# Patient Record
Sex: Female | Born: 1990 | Race: Asian | Hispanic: Yes | Marital: Married | Smoking: Former smoker
Health system: Southern US, Community
[De-identification: ages and names within clinical notes are randomized; demographics above are authoritative.]

## PROBLEM LIST (undated history)

## (undated) ENCOUNTER — Inpatient Hospital Stay (HOSPITAL_COMMUNITY): Payer: Self-pay

## (undated) DIAGNOSIS — B999 Unspecified infectious disease: Secondary | ICD-10-CM

## (undated) DIAGNOSIS — Z5189 Encounter for other specified aftercare: Secondary | ICD-10-CM

## (undated) DIAGNOSIS — R519 Headache, unspecified: Secondary | ICD-10-CM

## (undated) DIAGNOSIS — M419 Scoliosis, unspecified: Secondary | ICD-10-CM

## (undated) HISTORY — PX: HERNIA REPAIR: SHX51

## (undated) HISTORY — PX: BREAST SURGERY: SHX581

## (undated) HISTORY — PX: NO PAST SURGERIES: SHX2092

---

## 2012-07-10 DIAGNOSIS — M419 Scoliosis, unspecified: Secondary | ICD-10-CM

## 2012-07-10 HISTORY — DX: Scoliosis, unspecified: M41.9

## 2013-03-30 ENCOUNTER — Encounter (HOSPITAL_COMMUNITY): Payer: Self-pay | Admitting: *Deleted

## 2013-03-30 ENCOUNTER — Emergency Department (HOSPITAL_COMMUNITY): Payer: Self-pay

## 2013-03-30 ENCOUNTER — Emergency Department (HOSPITAL_COMMUNITY)
Admission: EM | Admit: 2013-03-30 | Discharge: 2013-03-30 | Disposition: A | Discharge status: Home / Self Care | Payer: Self-pay | Attending: Emergency Medicine | Admitting: Emergency Medicine

## 2013-03-30 DIAGNOSIS — O2390 Unspecified genitourinary tract infection in pregnancy, unspecified trimester: Secondary | ICD-10-CM | POA: Insufficient documentation

## 2013-03-30 DIAGNOSIS — R300 Dysuria: Secondary | ICD-10-CM | POA: Insufficient documentation

## 2013-03-30 DIAGNOSIS — Z792 Long term (current) use of antibiotics: Secondary | ICD-10-CM | POA: Insufficient documentation

## 2013-03-30 DIAGNOSIS — O368121 Decreased fetal movements, second trimester, fetus 1: Secondary | ICD-10-CM

## 2013-03-30 DIAGNOSIS — N898 Other specified noninflammatory disorders of vagina: Secondary | ICD-10-CM | POA: Insufficient documentation

## 2013-03-30 DIAGNOSIS — O368190 Decreased fetal movements, unspecified trimester, not applicable or unspecified: Secondary | ICD-10-CM | POA: Insufficient documentation

## 2013-03-30 DIAGNOSIS — Z79899 Other long term (current) drug therapy: Secondary | ICD-10-CM | POA: Insufficient documentation

## 2013-03-30 DIAGNOSIS — O9989 Other specified diseases and conditions complicating pregnancy, childbirth and the puerperium: Secondary | ICD-10-CM | POA: Insufficient documentation

## 2013-03-30 DIAGNOSIS — N390 Urinary tract infection, site not specified: Secondary | ICD-10-CM

## 2013-03-30 DIAGNOSIS — Z3490 Encounter for supervision of normal pregnancy, unspecified, unspecified trimester: Secondary | ICD-10-CM

## 2013-03-30 LAB — CBC WITH DIFFERENTIAL/PLATELET
Basophils Absolute: 0 10*3/uL (ref 0.0–0.1)
Basophils Relative: 0 % (ref 0–1)
Eosinophils Absolute: 0.1 10*3/uL (ref 0.0–0.7)
Eosinophils Relative: 1 % (ref 0–5)
Lymphocytes Relative: 18 % (ref 12–46)
MCH: 32.4 pg (ref 26.0–34.0)
MCV: 90 fL (ref 78.0–100.0)
Neutro Abs: 7.9 10*3/uL — ABNORMAL HIGH (ref 1.7–7.7)
Platelets: 214 10*3/uL (ref 150–400)
RDW: 13.5 % (ref 11.5–15.5)
WBC: 10.6 10*3/uL — ABNORMAL HIGH (ref 4.0–10.5)

## 2013-03-30 LAB — URINALYSIS, ROUTINE W REFLEX MICROSCOPIC
Bilirubin Urine: NEGATIVE
Hgb urine dipstick: NEGATIVE
Protein, ur: NEGATIVE mg/dL
Urobilinogen, UA: 1 mg/dL (ref 0.0–1.0)

## 2013-03-30 LAB — COMPREHENSIVE METABOLIC PANEL
ALT: 8 U/L (ref 0–35)
AST: 13 U/L (ref 0–37)
Albumin: 3.2 g/dL — ABNORMAL LOW (ref 3.5–5.2)
Alkaline Phosphatase: 42 U/L (ref 39–117)
CO2: 25 mEq/L (ref 19–32)
Calcium: 8.7 mg/dL (ref 8.4–10.5)
Chloride: 104 mEq/L (ref 96–112)
GFR calc Af Amer: >90 mL/min (ref >90)
GFR calc non Af Amer: >90 mL/min (ref >90)
Glucose, Bld: 83 mg/dL (ref 70–99)
Potassium: 3.6 mEq/L (ref 3.5–5.1)
Sodium: 137 mEq/L (ref 135–145)
Total Protein: 6.8 g/dL (ref 6.0–8.3)

## 2013-03-30 LAB — WET PREP, GENITAL
Clue Cells Wet Prep HPF POC: NONE SEEN
Trich, Wet Prep: NONE SEEN

## 2013-03-30 LAB — URINE MICROSCOPIC-ADD ON

## 2013-03-30 MED ORDER — CEPHALEXIN 500 MG PO CAPS: 500.0000 mg | ORAL_CAPSULE | Freq: Two times a day (BID) | ORAL | Status: DC

## 2013-03-30 MED ORDER — SODIUM CHLORIDE 0.9 % IV BOLUS (SEPSIS)
500.0000 mL | Freq: Once | INTRAVENOUS | Status: AC
  Administered 2013-03-30: 500 mL via INTRAVENOUS

## 2013-03-30 MED ORDER — DEXTROSE 5 % IV SOLN
1.0000 g | INTRAVENOUS | Status: DC
  Administered 2013-03-30: 1 g via INTRAVENOUS
  Filled 2013-03-30: qty 10

## 2013-03-30 NOTE — Progress Notes (Addendum)
Pt visiting from [place]. States she has not felt fm in 1 week. States her [facility] is Jun 24 2013.Audible fm. fhr briefly at 150bpm. Unable to get a fhr tracing due to fm. Pt is going to have an OB ultrsound. Pt also has a yellow-greenish vaginal d/c. She is c/o some abd pain. No vaginal bleeding or leaking of fluid.

## 2013-03-30 NOTE — ED Notes (Signed)
Fetal heart tones dopplered mid-lower abd 140-164  Beats per minute

## 2013-03-30 NOTE — Progress Notes (Signed)
RROB-RN spoke with Dr Marice Potter; told of pt with conflicting dates; date by lmp=27 5/7; date from todays u/s=18 6/7; pt here due to cramping, decreased fetal movement and green discharge.  Told Dr of positive fetal movement, fhr reactive and reassuring, no contractions seen, no vaginal bleeding; cervix closed and 4.9cm in length.  Pt diagnosed with uti, receiving antibiotics now for uti, ED Dr will perform pelvic exam and collect cultures and treat if anything seen.  Dr Marice Potter said that monitoring may be d/c'd, ED to continue with care and to d/c pt when cleared in ED.

## 2013-03-30 NOTE — ED Notes (Signed)
Pt returned from ultrasound

## 2013-03-30 NOTE — ED Notes (Signed)
Rapid OB at bedside 

## 2013-03-30 NOTE — ED Provider Notes (Signed)
CSN: [id number]     Arrival date & time 03/30/13  1543 History   First MD Initiated Contact with Patient 03/30/13 1630     Chief Complaint  Patient presents with  . 5 months preg  problems    (Consider location/radiation/quality/duration/timing/severity/associated sxs/prior Treatment) HPI Comments: 22 yo female from [place] presents with decreased fetal movement for one week.  Third pregnancy, no acute issues with this pregnancy but had an [place] outpt to check sex of baby.  No bleeding, mild discharge and dysuria.  No fevers.  Pt planning on moving to [place].  Mild lower cramping.  Intermittent.  Improves with time. Pt feels from dates she is 5 mo pregnant.  The history is provided by the patient.    Past Medical History  Diagnosis Date  . Preterm labor     pt-contractions with first pregnancy-del term   Past Surgical History  Procedure Laterality Date  . No past surgeries     History reviewed. No pertinent family history. History  Substance Use Topics  . Smoking status: Never Smoker   . Smokeless tobacco: Not on file  . Alcohol Use: No   OB History   Grav Para Term Preterm Abortions TAB SAB Ect Mult Living   3 2 2       2      Review of Systems  Constitutional: Negative for fever and chills.  HENT: Negative for neck pain and neck stiffness.   Eyes: Negative for visual disturbance.  Respiratory: Negative for shortness of breath.   Cardiovascular: Negative for chest pain.  Gastrointestinal: Negative for vomiting and abdominal pain.  Genitourinary: Positive for dysuria, vaginal discharge and vaginal pain. Negative for flank pain.  Musculoskeletal: Negative for back pain.  Skin: Negative for rash.  Neurological: Negative for light-headedness and headaches.    Allergies  Review of patient's allergies indicates no known allergies.  Home Medications   Current Outpatient Rx  Name  Route  Sig  Dispense  Refill  . Prenatal Vit-Fe Fumarate-FA (PRENATAL MULTIVITAMIN) TABS  tablet   Oral   Take 1 tablet by mouth daily at 12 noon.         . cephALEXin (KEFLEX) 500 MG capsule   Oral   Take 1 capsule (500 mg total) by mouth 2 (two) times daily.   14 capsule   0    BP 102/57  Pulse 80  Temp(Src) 98.4 F (36.9 C) (Oral)  Resp 18  SpO2 100% Physical Exam  Nursing note and vitals reviewed. Constitutional: She is oriented to person, place, and time. She appears well-developed and well-nourished.  HENT:  Head: Normocephalic and atraumatic.  Eyes: Conjunctivae are normal. Right eye exhibits no discharge. Left eye exhibits no discharge.  Neck: Normal range of motion. Neck supple. No tracheal deviation present.  Cardiovascular: Normal rate and regular rhythm.   Pulmonary/Chest: Effort normal and breath sounds normal.  Abdominal: Soft. She exhibits no distension. There is no tenderness. There is no guarding.  Genitourinary:  No lesions, mild white discharge, no focal pain  Musculoskeletal: She exhibits no edema.  Neurological: She is alert and oriented to person, place, and time.  Skin: Skin is warm. No rash noted.  Psychiatric: She has a normal mood and affect.    ED Course  Procedures (including critical care time) EMERGENCY DEPARTMENT [place] PREGNANCY "Study: Limited Ultrasound of the Pelvis for Pregnancy"  INDICATIONS:Pregnancy(required) Multiple views of the uterus and pelvic cavity were obtained in real-time with a multi-frequency probe.  APPROACH:Transabdominal  PERFORMED BY: Myself  IMAGES ARCHIVED?: Yes   PREGNANCY FREE FLUID: None   PREGNANCY FINDINGS: Intrauterine gestational sac noted and Fetal heart activity seen  INTERPRETATION: Viable intrauterine pregnancy  GESTATIONAL AGE, ESTIMATE: 18 wk 2 days  FETAL HEART RATE: 140s     Labs Review Labs Reviewed  WET PREP, GENITAL - Abnormal; Notable for the following:    WBC, Wet Prep HPF POC TOO NUMEROUS TO COUNT (*)    All other components within normal limits  URINALYSIS,  ROUTINE W REFLEX MICROSCOPIC - Abnormal; Notable for the following:    APPearance CLOUDY (*)    Ketones, ur 15 (*)    Leukocytes, UA LARGE (*)    All other components within normal limits  CBC WITH DIFFERENTIAL - Abnormal; Notable for the following:    WBC 10.6 (*)    HCT 35.0 (*)    Neutro Abs 7.9 (*)    All other components within normal limits  COMPREHENSIVE METABOLIC PANEL - Abnormal; Notable for the following:    Creatinine, Ser 0.46 (*)    Albumin 3.2 (*)    Total Bilirubin 0.2 (*)    All other components within normal limits  URINE MICROSCOPIC-ADD ON - Abnormal; Notable for the following:    Squamous Epithelial / LPF FEW (*)    Bacteria, UA MANY (*)    All other components within normal limits  URINE CULTURE  GC/CHLAMYDIA PROBE AMP   Imaging Review US Ob Limited  03/30/2013   *RADIOLOGY REPORT*  Clinical Data: Decreased fetal movement.  LIMITED OBSTETRIC ULTRASOUND  Number of Fetuses: 1 Heart Rate: 149 bpm Movement: Yes  Presentation: Cephalic Placental Location: Posterior Previa: No Amniotic Fluid (Subjective): Lower limits of normal.  BPD:  4.3 cm      18 w  6 d         EDC:  08/25/2013  MATERNAL FINDINGS: Cervix: Closed.  4.9 cm in length. Uterus/Adnexae:  Normal.  IMPRESSION: Normal appearing single intrauterine pregnancy of approximately 18- week 6 days gestation.  Recommend followup with non-emergent complete OB 14+ wk US examination for fetal biometric evaluation and anatomic survey if not already performed.   Original Report Authenticated By: Francene Boyers, M.D.    MDM   1. Decreased fetal movement in pregnancy, second trimester, fetus 1   2. UTI (urinary tract infection)   3. Pregnancy   4. Vaginal discharge    Bedside US confirmed IUP, 18 wks, normal hr.  Because pt felt she was further along formal US ordered. FOrmal US confirmed bedside.  UTI, rocephin and fluid bolus.  PO abx for home. OB nurse monitored infant, no acute issues.  FUp discussed.    DC    Enid Skeens, MD 03/30/13 2016

## 2013-03-30 NOTE — ED Notes (Signed)
The pt  Is 5 months preg and she has not felt the baby  Move for 7 days.  This her third pregnancy. C/o abd pain also lmp April.  She is visiting  From [place]  And saw her ob doctor the 5th of last month

## 2013-03-30 NOTE — ED Notes (Signed)
Ob rapid response rn notified and the pt is in the dept

## 2013-03-31 LAB — GC/CHLAMYDIA PROBE AMP: GC Probe RNA: NEGATIVE

## 2013-04-01 LAB — URINE CULTURE: Colony Count: 85000

## 2013-05-12 ENCOUNTER — Inpatient Hospital Stay (HOSPITAL_COMMUNITY)
Admission: AD | Admit: 2013-05-12 | Discharge: 2013-05-13 | Disposition: A | Discharge status: Home / Self Care | Payer: Medicaid Other | Source: Ambulatory Visit | Attending: Obstetrics & Gynecology | Admitting: Obstetrics & Gynecology

## 2013-05-12 ENCOUNTER — Encounter (HOSPITAL_COMMUNITY): Payer: Self-pay

## 2013-05-12 DIAGNOSIS — A499 Bacterial infection, unspecified: Secondary | ICD-10-CM | POA: Insufficient documentation

## 2013-05-12 DIAGNOSIS — R300 Dysuria: Secondary | ICD-10-CM | POA: Insufficient documentation

## 2013-05-12 DIAGNOSIS — B9689 Other specified bacterial agents as the cause of diseases classified elsewhere: Secondary | ICD-10-CM | POA: Insufficient documentation

## 2013-05-12 DIAGNOSIS — M545 Low back pain, unspecified: Secondary | ICD-10-CM | POA: Insufficient documentation

## 2013-05-12 DIAGNOSIS — N390 Urinary tract infection, site not specified: Secondary | ICD-10-CM | POA: Insufficient documentation

## 2013-05-12 DIAGNOSIS — N760 Acute vaginitis: Secondary | ICD-10-CM | POA: Insufficient documentation

## 2013-05-12 LAB — CBC
HCT: 30.2 % — ABNORMAL LOW (ref 36.0–46.0)
MCH: 31.5 pg (ref 26.0–34.0)
MCHC: 35.1 g/dL (ref 30.0–36.0)
MCV: 89.6 fL (ref 78.0–100.0)
Platelets: 247 10*3/uL (ref 150–400)
RBC: 3.37 MIL/uL — ABNORMAL LOW (ref 3.87–5.11)
RDW: 12.4 % (ref 11.5–15.5)
WBC: 11 10*3/uL — ABNORMAL HIGH (ref 4.0–10.5)

## 2013-05-12 LAB — URINALYSIS, ROUTINE W REFLEX MICROSCOPIC
Bilirubin Urine: NEGATIVE
Ketones, ur: NEGATIVE mg/dL
Nitrite: POSITIVE — AB
Protein, ur: NEGATIVE mg/dL

## 2013-05-12 LAB — WET PREP, GENITAL

## 2013-05-12 LAB — URINE MICROSCOPIC-ADD ON

## 2013-05-12 MED ORDER — METRONIDAZOLE 500 MG PO TABS: 500.0000 mg | ORAL_TABLET | Freq: Two times a day (BID) | ORAL | Status: DC

## 2013-05-12 MED ORDER — CEFTRIAXONE SODIUM 1 G IJ SOLR
1.0000 g | Freq: Once | INTRAMUSCULAR | Status: AC
  Administered 2013-05-12: 1 g via INTRAMUSCULAR
  Filled 2013-05-12: qty 10

## 2013-05-12 MED ORDER — SULFAMETHOXAZOLE-TRIMETHOPRIM 800-160 MG PO TABS: 1.0000 | ORAL_TABLET | Freq: Two times a day (BID) | ORAL | Status: DC

## 2013-05-12 NOTE — MAU Provider Note (Signed)
History   CSN: [id number]  Arrival date and time: 05/12/13 1852   None      Ataya Murdy is a 22 y.o. [id number] with history of UTI diagnosed in ER late September who maintains that symptoms of dysuria and incomplete bladder emptying have persisted in spite of completing a full course of Keflex as prescribed. She also endorses low back pain (CVA) on the right side. The patient also endorses a variable vaginal discharge ranging from thin and yellow/green to thick and white. Patient reports minimal vaginal tenderness mid-coitus which is not otherwise present. Patient denies vaginal bleeding, fever, and other constitutional symptoms. She reports having the same sexual partner for the past 7 years, has not had any additional partners, and does not suspect infidelity from her partner. She tested negative for G/C while in the ER. Patient's urine positive for nitrites and bacteria. The patient [place] from [place] of [place] and has not pursued [place] Medicaid or any form of prenatal care at this time; she did have US performed in the ER. Discussed pregnancy verification letter and trying to schedule an appointment at [facility] clinic for prenatal care. The patient denies ETOH, tobacco use, and illicit drug use. The patient denies fever.   Chief Complaint  Patient presents with  . Abdominal Pain  . Back Pain   Abdominal Pain Associated symptoms include dysuria and frequency. Pertinent negatives include no fever, hematuria, melena, myalgias, nausea, vomiting or weight loss.  Back Pain Associated symptoms include abdominal pain and dysuria. Pertinent negatives include no chest pain, fever, tingling, weakness or weight loss.    OB History   Grav Para Term Preterm Abortions TAB SAB Ect Mult Living   3 2 2       2       Past Medical History  Diagnosis Date  . Preterm labor     pt-contractions with first pregnancy-del term    Past Surgical History  Procedure Laterality Date  . No past surgeries       History reviewed. No pertinent family history.  History  Substance Use Topics  . Smoking status: Never Smoker   . Smokeless tobacco: Not on file  . Alcohol Use: No    Allergies: No Known Allergies  Prescriptions prior to admission  Medication Sig Dispense Refill  . calcium carbonate (TUMS - DOSED IN MG ELEMENTAL CALCIUM) 500 MG chewable tablet Chew 1 tablet by mouth 2 (two) times daily as needed for heartburn.        Review of Systems  Constitutional: Negative.  Negative for fever, chills, weight loss and diaphoresis.  HENT: Negative.   Eyes: Negative.  Negative for blurred vision and double vision.  Respiratory: Negative for cough, shortness of breath and wheezing.   Cardiovascular: Negative for chest pain, palpitations, claudication (patient does endorse some posterior radiculopathy in L lower extremity which has increased slightly in pregnancy.) and leg swelling.  Gastrointestinal: Positive for abdominal pain. Negative for heartburn, nausea, vomiting, blood in stool and melena.  Genitourinary: Positive for dysuria, urgency, frequency and flank pain. Negative for hematuria.       Pt endorses sensation of incomplete voiding. Urine is not cloudy, dark, or obvious for any blood. Urine normal color per pt.  Musculoskeletal: Positive for back pain (lumbar back pain / cva tenderness). Negative for joint pain and myalgias.  Skin: Negative.  Negative for itching and rash.  Neurological: Negative for dizziness, tingling, sensory change, focal weakness, seizures and weakness.  Endo/Heme/Allergies: Negative.   Psychiatric/Behavioral: Negative.  Physical Exam   Blood pressure 130/70, pulse 95, temperature 98.6 F (37 C), resp. rate 18, height 5\' 7"  (1.702 m), weight 163 lb (73.936 kg), last menstrual period 11/02/2012.  Physical Exam  Constitutional: She is oriented to person, place, and time. She appears well-developed and well-nourished. No distress.  HENT:  Head: Normocephalic  and atraumatic.  Right Ear: External ear normal.  Left Ear: External ear normal.  Eyes: Conjunctivae are normal. Right eye exhibits no discharge. Left eye exhibits no discharge.  Neck: No JVD present. No tracheal deviation present.  Cardiovascular: Normal rate, normal heart sounds and intact distal pulses.  Exam reveals no gallop and no friction rub.   No murmur heard. Respiratory: No stridor. No respiratory distress. She has no wheezes. She has no rales. She exhibits no tenderness.  GI: Soft. She exhibits no distension and no mass. There is tenderness (suprapubic). There is CVA tenderness (right side). There is no rebound and no guarding.  Genitourinary: Uterus is enlarged (appropriate for GA). Uterus is not tender. Cervix exhibits no motion tenderness, no discharge and no friability. No bleeding around the vagina. Tenderness: moderate amount of thin, white discharge noted with some mucus. Vaginal discharge found.  Musculoskeletal: She exhibits no edema and no tenderness.  Neurological: She is alert and oriented to person, place, and time. She has normal reflexes.  Skin: Skin is warm and dry. No rash noted. She is not diaphoretic. No erythema. No pallor.  Psychiatric: She has a normal mood and affect. Her behavior is normal. Judgment and thought content normal.  Cervix: closed, long, thick and posterior  Results for orders placed during the hospital encounter of 05/12/13 (from the past 24 hour(s))  URINALYSIS, ROUTINE W REFLEX MICROSCOPIC     Status: Abnormal   Collection Time    05/12/13  7:40 PM      Result Value Range   Color, Urine YELLOW  YELLOW   APPearance HAZY (*) CLEAR   Specific Gravity, Urine 1.010  1.005 - 1.030   pH 6.5  5.0 - 8.0   Glucose, UA NEGATIVE  NEGATIVE mg/dL   Hgb urine dipstick NEGATIVE  NEGATIVE   Bilirubin Urine NEGATIVE  NEGATIVE   Ketones, ur NEGATIVE  NEGATIVE mg/dL   Protein, ur NEGATIVE  NEGATIVE mg/dL   Urobilinogen, UA 0.2  0.0 - 1.0 mg/dL   Nitrite  POSITIVE (*) NEGATIVE   Leukocytes, UA SMALL (*) NEGATIVE  URINE MICROSCOPIC-ADD ON     Status: Abnormal   Collection Time    05/12/13  7:40 PM      Result Value Range   Squamous Epithelial / LPF RARE  RARE   WBC, UA 3-6  <3 WBC/hpf   Bacteria, UA MANY (*) RARE  CBC     Status: Abnormal   Collection Time    05/12/13 10:55 PM      Result Value Range   WBC 11.0 (*) 4.0 - 10.5 K/uL   RBC 3.37 (*) 3.87 - 5.11 MIL/uL   Hemoglobin 10.6 (*) 12.0 - 15.0 g/dL   HCT 16.1 (*) 09.6 - 04.5 %   MCV 89.6  78.0 - 100.0 fL   MCH 31.5  26.0 - 34.0 pg   MCHC 35.1  30.0 - 36.0 g/dL   RDW 40.9  81.1 - 91.4 %   Platelets 247  150 - 400 K/uL  WET PREP, GENITAL     Status: Abnormal   Collection Time    05/12/13 11:10 PM  Result Value Range   Yeast Wet Prep HPF POC NONE SEEN  NONE SEEN   Trich, Wet Prep NONE SEEN  NONE SEEN   Clue Cells Wet Prep HPF POC FEW (*) NONE SEEN   WBC, Wet Prep HPF POC FEW (*) NONE SEEN    MAU Course  Procedures  MDM Urinalysis (positive for nitrites and bacteria)  Assessment and Plan   2300 - Care assumed from [place], CNM and [employer], PA-S  Fetal Monitoring: Baseline: 145 bpm, moderate variability, + accelerations, no decelerations Contractions: none  MDM CBC drawn Pelvic exam complete and noted above Discussed patient with Dr. Marice Potter. Low suspicion for pyelonephritis at this time. Treat outpatient with bactrim x 7 days and give IM Rocephin in MAU today for resistant UTI  A: UTI Bacterial vaginosis  P: Discharge home Rx for Bactrim and Flagyl given to patient Patient advised to increase PO hydration as tolerated Pregnancy confirmation letter given with Medicaid assistance information Patient referred to [facility] clinic to start prenatal care Discussed warning signs for Pyelonephritis Patient may return to MAU as needed or if her condition were to change or worsen  Freddi Starr, PA-C 05/12/2013 11:53 PM

## 2013-05-12 NOTE — MAU Note (Signed)
Pt G3 P2, unknown EDC, no prenatal care, having lower abd and back pain x 3 days that became constant today.  Mild pain with urination, denies bleeding, white, watery discharge.

## 2013-05-15 LAB — URINE CULTURE

## 2013-05-16 ENCOUNTER — Telehealth (HOSPITAL_COMMUNITY): Payer: Self-pay | Admitting: Obstetrics and Gynecology

## 2013-05-16 MED ORDER — NITROFURANTOIN MONOHYD MACRO 100 MG PO CAPS: 100.0000 mg | ORAL_CAPSULE | Freq: Two times a day (BID) | ORAL | Status: DC

## 2013-05-16 NOTE — Telephone Encounter (Signed)
Informed pt that bactrim was resistant and that I changed her antibiotic to Macrobid 1 pill BID times 7 days. I informed her to stop the bactrim and call us with any concerns. Pt will pick up the medication at her pharmacy.

## 2013-05-29 ENCOUNTER — Encounter: Payer: Self-pay | Admitting: Family

## 2013-05-29 ENCOUNTER — Ambulatory Visit (INDEPENDENT_AMBULATORY_CARE_PROVIDER_SITE_OTHER): Payer: Medicaid Other | Admitting: Family

## 2013-05-29 VITALS — BP 114/74 | Temp 98.3°F | Ht 66.0 in | Wt 165.4 lb

## 2013-05-29 DIAGNOSIS — O0990 Supervision of high risk pregnancy, unspecified, unspecified trimester: Secondary | ICD-10-CM | POA: Insufficient documentation

## 2013-05-29 DIAGNOSIS — O09219 Supervision of pregnancy with history of pre-term labor, unspecified trimester: Secondary | ICD-10-CM

## 2013-05-29 LAB — POCT URINALYSIS DIP (DEVICE)
Bilirubin Urine: NEGATIVE
Hgb urine dipstick: NEGATIVE
Ketones, ur: NEGATIVE mg/dL
Protein, ur: NEGATIVE mg/dL
Specific Gravity, Urine: >=1.03 (ref 1.005–1.030)
pH: 7 (ref 5.0–8.0)

## 2013-05-29 MED ORDER — CONCEPT DHA 53.5-38-1 MG PO CAPS: 1.0000 | ORAL_CAPSULE | Freq: Every day | ORAL | Status: DC

## 2013-05-29 NOTE — Progress Notes (Signed)
Subjective:    Jennifer Chaney is a [id number] [redacted]w[redacted]d being seen today for her first obstetrical visit.  Her obstetrical history is significant for preterm dilation with term delivery and possible GBS infected infant.  Last infant was hospitalized and she was told she passed infection to baby.  . Patient does intend to breast feed. Pregnancy history fully reviewed.  Patient reports no bleeding, no contractions, no cramping and no leaking.  Filed Vitals:   05/29/13 0832 05/29/13 0837  BP: 114/74   Temp: 98.3 F (36.8 C)   Height:  5\' 6"  (1.676 m)  Weight: 165 lb 6.4 oz (75.025 kg)     HISTORY: OB History  Gravida Para Term Preterm AB SAB TAB Ectopic Multiple Living  4 2 2  1 1    2     # Outcome Date GA Lbr Len/2nd Weight Sex Delivery Anes PTL Lv  4 CUR           3 TRM 01/2012 [redacted]w[redacted]d  7 lb 6 oz (3.345 kg) F SVD EPI Y Y     Comments: Early preterm dilation (3 cm at 27 wks); states high risk baby too low, baby NICU breathing issues, born [place], [place]; GBS infection ?  2 TRM 12/2007 [redacted]w[redacted]d  6 lb 4 oz (2.835 kg) F SVD EPI  Y     Comments: PTL at 13 wk, born [place], [place]  1 SAB 2008 [redacted]w[redacted]d            Comments: Loss after falling (abdominal trauma)     Past Medical History  Diagnosis Date  . Preterm labor     pt-contractions with first pregnancy-del term   Past Surgical History  Procedure Laterality Date  . No past surgeries     History reviewed. No pertinent family history.   Exam    Filed Vitals:   05/29/13 0832  BP: 114/74  Temp: 98.3 F (36.8 C)   Exam   BP 114/74  Temp(Src) 98.3 F (36.8 C)  Ht 5\' 6"  (1.676 m)  Wt 165 lb 6.4 oz (75.025 kg)  BMI 26.71 kg/m2  LMP 11/02/2012 Uterine Size: size equals dates  Pelvic Exam:    Perineum: No Hemorrhoids, Normal Perineum   Vulva: normal   Vagina:  normal mucosa, normal discharge, no palpable nodules   pH: Not done   Cervix: no bleeding following Pap, no cervical motion tenderness and no lesions   Adnexa: normal adnexa  and no mass, fullness, tenderness   Bony Pelvis: Adequate  System: Breast:  No nipple retraction or dimpling, No nipple discharge or bleeding, No axillary or supraclavicular adenopathy, Normal to palpation without dominant masses   Skin: normal coloration and turgor, no rashes    Neurologic: negative   Extremities: normal strength, tone, and muscle mass   HEENT neck supple with midline trachea and thyroid without masses   Mouth/Teeth mucous membranes moist, pharynx normal without lesions   Neck supple and no masses   Cardiovascular: regular rate and rhythm, no murmurs or gallops   Respiratory:  appears well, vitals normal, no respiratory distress, acyanotic, normal RR, neck free of mass or lymphadenopathy, chest clear, no wheezing, crepitations, rhonchi, normal symmetric air entry   Abdomen: soft, non-tender; bowel sounds normal; no masses,  no organomegaly   Urinary: urethral meatus normal      Assessment:    Pregnancy: [id number] Patient Active Problem List   Diagnosis Date Noted  . Supervision of high-risk pregnancy 05/29/2013    Hx of Preterm  Dilation (term delivery)     Plan:     Initial labs drawn. 1 hr GCT collected. Prenatal vitamins. Problem list reviewed and updated. Genetic Screening:  Too Late  Ultrasound discussed; fetal survey: requested.  Follow up in 2 weeks.   [facility] 05/29/2013

## 2013-05-29 NOTE — Progress Notes (Signed)
  P= 103, Here for new ob. States took all of macrobid and flagyl, but was unable to get third medicine filled. States had IUD in when got pregnant, states was told it came out by itself by providers in [place]. C/o pelvic pressure/groin. Had some prenatal care in [place] , but not labs, etc.Given new patient information. Discussed bmi/appropriate weight gain.

## 2013-05-30 LAB — OBSTETRIC PANEL
Basophils Absolute: 0 10*3/uL (ref 0.0–0.1)
Eosinophils Absolute: 0.1 10*3/uL (ref 0.0–0.7)
Eosinophils Relative: 1 % (ref 0–5)
HCT: 31.9 % — ABNORMAL LOW (ref 36.0–46.0)
Hemoglobin: 11 g/dL — ABNORMAL LOW (ref 12.0–15.0)
Lymphocytes Relative: 21 % (ref 12–46)
Lymphs Abs: 1.9 10*3/uL (ref 0.7–4.0)
MCH: 31.2 pg (ref 26.0–34.0)
MCV: 90.4 fL (ref 78.0–100.0)
Monocytes Absolute: 0.8 10*3/uL (ref 0.1–1.0)
Monocytes Relative: 9 % (ref 3–12)
Platelets: 248 10*3/uL (ref 150–400)
RBC: 3.53 MIL/uL — ABNORMAL LOW (ref 3.87–5.11)
Rh Type: POSITIVE
Rubella: 1.33 Index — ABNORMAL HIGH (ref <0.90)
WBC: 9 10*3/uL (ref 4.0–10.5)

## 2013-05-30 LAB — PRESCRIPTION MONITORING PROFILE (19 PANEL)
Barbiturate Screen, Urine: NEGATIVE ng/mL
Benzodiazepine Screen, Urine: NEGATIVE ng/mL
Cannabinoid Scrn, Ur: NEGATIVE ng/mL
Cocaine Metabolites: NEGATIVE ng/mL
Creatinine, Urine: 205.41 mg/dL (ref >20.0)
Fentanyl, Ur: NEGATIVE ng/mL
MDMA URINE: NEGATIVE ng/mL
Meperidine, Ur: NEGATIVE ng/mL
Opiate Screen, Urine: NEGATIVE ng/mL
Oxycodone Screen, Ur: NEGATIVE ng/mL
Phencyclidine, Ur: NEGATIVE ng/mL
Tramadol Scrn, Ur: NEGATIVE ng/mL
Zolpidem, Urine: NEGATIVE ng/mL
pH, Initial: 6.7 pH (ref 4.5–8.9)

## 2013-05-31 ENCOUNTER — Encounter: Payer: Self-pay | Admitting: Family

## 2013-06-03 ENCOUNTER — Other Ambulatory Visit: Payer: Self-pay | Admitting: Family

## 2013-06-03 ENCOUNTER — Ambulatory Visit (HOSPITAL_COMMUNITY)
Admission: RE | Admit: 2013-06-03 | Discharge: 2013-06-03 | Disposition: A | Discharge status: Home / Self Care | Payer: Medicaid Other | Source: Ambulatory Visit | Attending: Family | Admitting: Family

## 2013-06-03 DIAGNOSIS — Z3689 Encounter for other specified antenatal screening: Secondary | ICD-10-CM | POA: Insufficient documentation

## 2013-06-03 DIAGNOSIS — O0990 Supervision of high risk pregnancy, unspecified, unspecified trimester: Secondary | ICD-10-CM

## 2013-06-04 ENCOUNTER — Encounter: Payer: Self-pay | Admitting: Family

## 2013-06-12 ENCOUNTER — Ambulatory Visit (INDEPENDENT_AMBULATORY_CARE_PROVIDER_SITE_OTHER): Payer: Medicaid Other | Admitting: Family

## 2013-06-12 VITALS — BP 118/72 | Temp 97.5°F | Wt 167.4 lb

## 2013-06-12 DIAGNOSIS — M419 Scoliosis, unspecified: Secondary | ICD-10-CM

## 2013-06-12 DIAGNOSIS — O0993 Supervision of high risk pregnancy, unspecified, third trimester: Secondary | ICD-10-CM

## 2013-06-12 DIAGNOSIS — M4120 Other idiopathic scoliosis, site unspecified: Secondary | ICD-10-CM

## 2013-06-12 DIAGNOSIS — Z23 Encounter for immunization: Secondary | ICD-10-CM

## 2013-06-12 DIAGNOSIS — M259 Joint disorder, unspecified: Secondary | ICD-10-CM

## 2013-06-12 LAB — POCT URINALYSIS DIP (DEVICE)
Bilirubin Urine: NEGATIVE
Ketones, ur: NEGATIVE mg/dL
Nitrite: NEGATIVE
Protein, ur: 30 mg/dL — AB
Specific Gravity, Urine: >=1.03 (ref 1.005–1.030)
Urobilinogen, UA: 0.2 mg/dL (ref 0.0–1.0)
pH: 7 (ref 5.0–8.0)

## 2013-06-12 MED ORDER — ACETAMINOPHEN-CODEINE 300-30 MG PO TABS: 1.0000 | ORAL_TABLET | Freq: Four times a day (QID) | ORAL | Status: DC | PRN

## 2013-06-12 MED ORDER — NITROFURANTOIN MONOHYD MACRO 100 MG PO CAPS: 100.0000 mg | ORAL_CAPSULE | Freq: Two times a day (BID) | ORAL | Status: DC

## 2013-06-12 NOTE — Progress Notes (Signed)
Reviewed OB labs/pap smear.  Report lower back pain that pt feels is related to scoliosis. Used to have an orthopedic doctor in [place] and was supposed to get surgery, but got pregnant.  Large leukocytes in urine > no UTI symptoms > RX Macrobid and urine culture.  RX for Tylenol #3 and referral to ortho.

## 2013-06-12 NOTE — Progress Notes (Signed)
p=106 

## 2013-06-12 NOTE — Progress Notes (Signed)
[facility] called and will see patient after they review her MRI/x-rays from [place]. Patient to drop off her records there and they will contact her for an appt. Contact information given to patient.

## 2013-06-12 NOTE — Progress Notes (Signed)
Declines Tdap and flu vaccine.

## 2013-06-14 LAB — CULTURE, OB URINE

## 2013-06-24 ENCOUNTER — Encounter: Payer: Self-pay | Admitting: *Deleted

## 2013-06-24 DIAGNOSIS — O9989 Other specified diseases and conditions complicating pregnancy, childbirth and the puerperium: Secondary | ICD-10-CM

## 2013-06-24 DIAGNOSIS — M259 Joint disorder, unspecified: Secondary | ICD-10-CM | POA: Insufficient documentation

## 2013-06-24 DIAGNOSIS — M899 Disorder of bone, unspecified: Secondary | ICD-10-CM

## 2013-06-26 ENCOUNTER — Ambulatory Visit (INDEPENDENT_AMBULATORY_CARE_PROVIDER_SITE_OTHER): Payer: Medicaid Other | Admitting: Family Medicine

## 2013-06-26 ENCOUNTER — Encounter: Payer: Self-pay | Admitting: Family Medicine

## 2013-06-26 VITALS — BP 112/73 | Temp 97.5°F | Wt 173.7 lb

## 2013-06-26 DIAGNOSIS — O2343 Unspecified infection of urinary tract in pregnancy, third trimester: Secondary | ICD-10-CM

## 2013-06-26 DIAGNOSIS — M549 Dorsalgia, unspecified: Secondary | ICD-10-CM

## 2013-06-26 DIAGNOSIS — Z23 Encounter for immunization: Secondary | ICD-10-CM

## 2013-06-26 DIAGNOSIS — O0993 Supervision of high risk pregnancy, unspecified, third trimester: Secondary | ICD-10-CM

## 2013-06-26 DIAGNOSIS — O2390 Unspecified genitourinary tract infection in pregnancy, unspecified trimester: Secondary | ICD-10-CM

## 2013-06-26 DIAGNOSIS — O9989 Other specified diseases and conditions complicating pregnancy, childbirth and the puerperium: Secondary | ICD-10-CM

## 2013-06-26 LAB — POCT URINALYSIS DIP (DEVICE)
Bilirubin Urine: NEGATIVE
Glucose, UA: NEGATIVE mg/dL
Hgb urine dipstick: NEGATIVE
Nitrite: NEGATIVE
Protein, ur: NEGATIVE mg/dL
Specific Gravity, Urine: 1.025 (ref 1.005–1.030)
pH: 7 (ref 5.0–8.0)

## 2013-06-26 MED ORDER — CYCLOBENZAPRINE HCL 5 MG PO TABS: 5.0000 mg | ORAL_TABLET | Freq: Three times a day (TID) | ORAL | Status: DC | PRN

## 2013-06-26 MED ORDER — TETANUS-DIPHTH-ACELL PERTUSSIS 5-2.5-18.5 LF-MCG/0.5 IM SUSP: 0.5000 mL | Freq: Once | INTRAMUSCULAR | Status: DC

## 2013-06-26 NOTE — Addendum Note (Signed)
Addended by: Candelaria Stagers E on: 06/26/2013 10:05 AM   Modules accepted: Orders

## 2013-06-26 NOTE — Progress Notes (Signed)
+  FM no lof no vb, occasional ctx. UTI tx with macrobid - TOC Complains of lower uterine pressure. Repeat culture today. Complains of back pain - flexeril today.  Jennifer Chaney is a 22 y.o. [id number] at [redacted]w[redacted]d here for ROB visit.  Discussed with Patient:  -Plans to breast feed.  All questions answered. -Continue prenatal vitamins. -Reviewed fetal kick counts Pt to perform daily at a time when the baby is active, lie laterally with both hands on belly in quiet room and count all movements (hiccups, shoulder rolls, obvious kicks, etc); pt is to report to clinic L&D for less than 10 movements felt in a one hour time period-pt told as soon as she counts 10 movements the count is complete.  - Routine precautions discussed (depression, infection s/s).   Patient provided with all pertinent phone numbers for emergencies. - RTC for any VB, regular, painful cramps/ctxs occurring at a rate of >2/10 min, fever (100.5 or higher), n/v/d, any pain that is unresolving or worsening, LOF, decreased fetal movement, CP, SOB, edema - RTC in 2 weeks for next appt.   Problems: Patient Active Problem List   Diagnosis Date Noted  . Bone and joint disorders of maternal back, pelvis, and lower limbs, antepartum(648.73) 06/24/2013  . Scoliosis 06/12/2013  . Supervision of high-risk pregnancy 05/29/2013    To Do: 1. Tdap will be given today  [ ]  Vaccines: Flu: declines  Tdap: 06/26/2013 [ ]  BCM: mirena [ ]  Readiness: baby has a place to sleep, car seat, other baby necessities.  Edu: [ ]  PTL precautions; [ ]  BF class; [ ]  childbirth class; [ ]   BF counseling;

## 2013-06-26 NOTE — Progress Notes (Signed)
P= 84 C/o of intermittent lower abdominal/pelvic pressure. Edema hands and feet. White/yellow discharge causing itching and discomfort.

## 2013-06-26 NOTE — Patient Instructions (Addendum)
Third Trimester of Pregnancy The third trimester is from week 29 through week 42, months 7 through 9. The third trimester is a time when the fetus is growing rapidly. At the end of the ninth month, the fetus is about 20 inches in length and weighs 6 10 pounds.  BODY CHANGES Your body goes through many changes during pregnancy. The changes vary from woman to woman.   Your weight will continue to increase. You can expect to gain 25 35 pounds (11 16 kg) by the end of the pregnancy.  You may begin to get stretch marks on your hips, abdomen, and breasts.  You may urinate more often because the fetus is moving lower into your pelvis and pressing on your bladder.  You may develop or continue to have heartburn as a result of your pregnancy.  You may develop constipation because certain hormones are causing the muscles that push waste through your intestines to slow down.  You may develop hemorrhoids or swollen, bulging veins (varicose veins).  You may have pelvic pain because of the weight gain and pregnancy hormones relaxing your joints between the bones in your pelvis. Back aches may result from over exertion of the muscles supporting your posture.  Your breasts will continue to grow and be tender. A yellow discharge may leak from your breasts called colostrum.  Your belly button may stick out.  You may feel short of breath because of your expanding uterus.  You may notice the fetus "dropping," or moving lower in your abdomen.  You may have a bloody mucus discharge. This usually occurs a few days to a week before labor begins.  Your cervix becomes thin and soft (effaced) near your due date. WHAT TO EXPECT AT YOUR PRENATAL EXAMS  You will have prenatal exams every 2 weeks until week 36. Then, you will have weekly prenatal exams. During a routine prenatal visit:  You will be weighed to make sure you and the fetus are growing normally.  Your blood pressure is taken.  Your abdomen will be  measured to track your baby's growth.  The fetal heartbeat will be listened to.  Any test results from the previous visit will be discussed.  You may have a cervical check near your due date to see if you have effaced. At around 36 weeks, your caregiver will check your cervix. At the same time, your caregiver will also perform a test on the secretions of the vaginal tissue. This test is to determine if a type of bacteria, Group B streptococcus, is present. Your caregiver will explain this further. Your caregiver may ask you:  What your birth plan is.  How you are feeling.  If you are feeling the baby move.  If you have had any abnormal symptoms, such as leaking fluid, bleeding, severe headaches, or abdominal cramping.  If you have any questions. Other tests or screenings that may be performed during your third trimester include:  Blood tests that check for low iron levels (anemia).  Fetal testing to check the health, activity level, and growth of the fetus. Testing is done if you have certain medical conditions or if there are problems during the pregnancy. FALSE LABOR You may feel small, irregular contractions that eventually go away. These are called Braxton Hicks contractions, or false labor. Contractions may last for hours, days, or even weeks before true labor sets in. If contractions come at regular intervals, intensify, or become painful, it is best to be seen by your caregiver.  SIGNS OF LABOR   Menstrual-like cramps.  Contractions that are 5 minutes apart or less.  Contractions that start on the top of the uterus and spread down to the lower abdomen and back.  A sense of increased pelvic pressure or back pain.  A watery or bloody mucus discharge that comes from the vagina. If you have any of these signs before the 37th week of pregnancy, call your caregiver right away. You need to go to the hospital to get checked immediately. HOME CARE INSTRUCTIONS   Avoid all  smoking, herbs, alcohol, and unprescribed drugs. These chemicals affect the formation and growth of the baby.  Follow your caregiver's instructions regarding medicine use. There are medicines that are either safe or unsafe to take during pregnancy.  Exercise only as directed by your caregiver. Experiencing uterine cramps is a good sign to stop exercising.  Continue to eat regular, healthy meals.  Wear a good support bra for breast tenderness.  Do not use hot tubs, steam rooms, or saunas.  Wear your seat belt at all times when driving.  Avoid raw meat, uncooked cheese, cat litter boxes, and soil used by cats. These carry germs that can cause birth defects in the baby.  Take your prenatal vitamins.  Try taking a stool softener (if your caregiver approves) if you develop constipation. Eat more high-fiber foods, such as fresh vegetables or fruit and whole grains. Drink plenty of fluids to keep your urine clear or pale yellow.  Take warm sitz baths to soothe any pain or discomfort caused by hemorrhoids. Use hemorrhoid cream if your caregiver approves.  If you develop varicose veins, wear support hose. Elevate your feet for 15 minutes, 3 4 times a day. Limit salt in your diet.  Avoid heavy lifting, wear low heal shoes, and practice good posture.  Rest a lot with your legs elevated if you have leg cramps or low back pain.  Visit your dentist if you have not gone during your pregnancy. Use a soft toothbrush to brush your teeth and be gentle when you floss.  A sexual relationship may be continued unless your caregiver directs you otherwise.  Do not travel far distances unless it is absolutely necessary and only with the approval of your caregiver.  Take prenatal classes to understand, practice, and ask questions about the labor and delivery.  Make a trial run to the hospital.  Pack your hospital bag.  Prepare the baby's nursery.  Continue to go to all your prenatal visits as directed  by your caregiver. SEEK MEDICAL CARE IF:  You are unsure if you are in labor or if your water has broken.  You have dizziness.  You have mild pelvic cramps, pelvic pressure, or nagging pain in your abdominal area.  You have persistent nausea, vomiting, or diarrhea.  You have a bad smelling vaginal discharge.  You have pain with urination. SEEK IMMEDIATE MEDICAL CARE IF:   You have a fever.  You are leaking fluid from your vagina.  You have spotting or bleeding from your vagina.  You have severe abdominal cramping or pain.  You have rapid weight loss or gain.  You have shortness of breath with chest pain.  You notice sudden or extreme swelling of your face, hands, ankles, feet, or legs.  You have not felt your baby move in over an hour.  You have severe headaches that do not go away with medicine.  You have vision changes. Document Released: 06/20/2001 Document Revised: 02/26/2013 Document Reviewed:   08/27/2012 [employer] Patient Information 2014 [place], [place]. Back Exercises These exercises may help you when beginning to rehabilitate your injury. Your symptoms may resolve with or without further involvement from your physician, physical therapist or athletic trainer. While completing these exercises, remember:   Restoring tissue flexibility helps normal motion to return to the joints. This allows healthier, less painful movement and activity.  An effective stretch should be held for at least 30 seconds.  A stretch should never be painful. You should only feel a gentle lengthening or release in the stretched tissue. STRETCH  Extension, Prone on Elbows   Lie on your stomach on the floor, a bed will be too soft. Place your palms about shoulder width apart and at the height of your head.  Place your elbows under your shoulders. If this is too painful, stack pillows under your chest.  Allow your body to relax so that your hips drop lower and make contact more completely  with the floor.  Hold this position for __________ seconds.  Slowly return to lying flat on the floor. Repeat __________ times. Complete this exercise __________ times per day.  RANGE OF MOTION  Extension, Prone Press Ups   Lie on your stomach on the floor, a bed will be too soft. Place your palms about shoulder width apart and at the height of your head.  Keeping your back as relaxed as possible, slowly straighten your elbows while keeping your hips on the floor. You may adjust the placement of your hands to maximize your comfort. As you gain motion, your hands will come more underneath your shoulders.  Hold this position __________ seconds.  Slowly return to lying flat on the floor. Repeat __________ times. Complete this exercise __________ times per day.  RANGE OF MOTION- Quadruped, Neutral Spine   Assume a hands and knees position on a firm surface. Keep your hands under your shoulders and your knees under your hips. You may place padding under your knees for comfort.  Drop your head and point your tail bone toward the ground below you. This will round out your low back like an angry cat. Hold this position for __________ seconds.  Slowly lift your head and release your tail bone so that your back sags into a large arch, like an old horse.  Hold this position for __________ seconds.  Repeat this until you feel limber in your low back.  Now, find your "sweet spot." This will be the most comfortable position somewhere between the two previous positions. This is your neutral spine. Once you have found this position, tense your stomach muscles to support your low back.  Hold this position for __________ seconds. Repeat __________ times. Complete this exercise __________ times per day.  STRETCH  Flexion, Single Knee to Chest   Lie on a firm bed or floor with both legs extended in front of you.  Keeping one leg in contact with the floor, bring your opposite knee to your chest. Hold  your leg in place by either grabbing behind your thigh or at your knee.  Pull until you feel a gentle stretch in your low back. Hold __________ seconds.  Slowly release your grasp and repeat the exercise with the opposite side. Repeat __________ times. Complete this exercise __________ times per day.  STRETCH - Hamstrings, Standing  Stand or sit and extend your right / left leg, placing your foot on a chair or foot stool  Keeping a slight arch in your low back and your hips  straight forward.  Lead with your chest and lean forward at the waist until you feel a gentle stretch in the back of your right / left knee or thigh. (When done correctly, this exercise requires leaning only a small distance.)  Hold this position for __________ seconds. Repeat __________ times. Complete this stretch __________ times per day. STRENGTHENING  Deep Abdominals, Pelvic Tilt   Lie on a firm bed or floor. Keeping your legs in front of you, bend your knees so they are both pointed toward the ceiling and your feet are flat on the floor.  Tense your lower abdominal muscles to press your low back into the floor. This motion will rotate your pelvis so that your tail bone is scooping upwards rather than pointing at your feet or into the floor.  With a gentle tension and even breathing, hold this position for __________ seconds. Repeat __________ times. Complete this exercise __________ times per day.  STRENGTHENING  Abdominals, Crunches   Lie on a firm bed or floor. Keeping your legs in front of you, bend your knees so they are both pointed toward the ceiling and your feet are flat on the floor. Cross your arms over your chest.  Slightly tip your chin down without bending your neck.  Tense your abdominals and slowly lift your trunk high enough to just clear your shoulder blades. Lifting higher can put excessive stress on the low back and does not further strengthen your abdominal muscles.  Control your return to  the starting position. Repeat __________ times. Complete this exercise __________ times per day.  STRENGTHENING  Quadruped, Opposite UE/LE Lift   Assume a hands and knees position on a firm surface. Keep your hands under your shoulders and your knees under your hips. You may place padding under your knees for comfort.  Find your neutral spine and gently tense your abdominal muscles so that you can maintain this position. Your shoulders and hips should form a rectangle that is parallel with the floor and is not twisted.  Keeping your trunk steady, lift your right hand no higher than your shoulder and then your left leg no higher than your hip. Make sure you are not holding your breath. Hold this position __________ seconds.  Continuing to keep your abdominal muscles tense and your back steady, slowly return to your starting position. Repeat with the opposite arm and leg. Repeat __________ times. Complete this exercise __________ times per day. Document Released: 07/14/2005 Document Revised: 09/18/2011 Document Reviewed: 10/08/2008 [facility] Patient Information 2014 [place], [place].

## 2013-06-26 NOTE — Addendum Note (Signed)
Addended by: Franchot Mimes on: 06/26/2013 11:52 AM   Modules accepted: Orders

## 2013-06-29 LAB — CULTURE, OB URINE: Colony Count: >=100000

## 2013-07-04 ENCOUNTER — Telehealth: Payer: Self-pay | Admitting: Family Medicine

## 2013-07-04 MED ORDER — CEPHALEXIN 500 MG PO CAPS: 500.0000 mg | ORAL_CAPSULE | Freq: Two times a day (BID) | ORAL | Status: DC

## 2013-07-04 MED ORDER — CEPHALEXIN 500 MG PO CAPS: 500.0000 mg | ORAL_CAPSULE | Freq: Four times a day (QID) | ORAL | Status: DC

## 2013-07-04 NOTE — Telephone Encounter (Signed)
TOC + for persistent E. Coli infection. Will treat with keflex 500mg  QID x7 days, then decrease to daily for suppression pt will need TOC in clinic. Unable to contact based on numbers in demographics. Will have nursing attempt to contact.  Tawana Scale, MD OB Fellow

## 2013-07-07 MED ORDER — CEPHALEXIN 500 MG PO CAPS: 500.0000 mg | ORAL_CAPSULE | Freq: Four times a day (QID) | ORAL | Status: DC

## 2013-07-07 MED ORDER — CEPHALEXIN 500 MG PO CAPS: 500.0000 mg | ORAL_CAPSULE | Freq: Two times a day (BID) | ORAL | Status: AC

## 2013-07-07 MED ORDER — CEPHALEXIN 500 MG PO CAPS: 500.0000 mg | ORAL_CAPSULE | Freq: Four times a day (QID) | ORAL | Status: AC

## 2013-07-07 NOTE — Telephone Encounter (Signed)
Letter sent. Medications would not e-prescribe. Called medications in to pharmacy.

## 2013-07-07 NOTE — Telephone Encounter (Signed)
Called pt. Went straight to voicemail which stated mailbox full-- unable to leave message. Will send patient a letter.

## 2013-07-10 NOTE — L&D Delivery Note (Signed)
Attestation of Attending Supervision of Advanced Practitioner (PA/CNM/NP): Evaluation and management procedures were performed by the Advanced Practitioner under my supervision and collaboration.  I have reviewed the Advanced Practitioner's note and chart, and I agree with the management and plan.  Lakechia Nay, MD, FACOG Attending Obstetrician & Gynecologist Faculty Practice, [facility]  

## 2013-07-10 NOTE — L&D Delivery Note (Signed)
Delivery Note After a 15 minute second stage, At 8:13 AM a viable female was delivered via Vaginal, Spontaneous Delivery (Presentation: LOA;  ).  APGAR:9/9 , ; weight .pending Placenta status: Intact, Spontaneous.  Cord: 3 vessels with the following complications: None.    Anesthesia: Epidural  Episiotomy: None Lacerations: None Suture Repair: n/a Est. Blood Loss (mL): 200  Mom to postpartum.  Baby to Couplet care / Skin to Skin  Delivery under my supervision by Dr. Lupita ShutterFeeny.  CRESENZO-DISHMAN,Jennifer Chaney 08/22/2013, 8:22 AM

## 2013-07-31 ENCOUNTER — Ambulatory Visit (INDEPENDENT_AMBULATORY_CARE_PROVIDER_SITE_OTHER): Payer: Medicaid Other | Admitting: Family

## 2013-07-31 VITALS — BP 109/72 | Temp 97.7°F | Wt 173.7 lb

## 2013-07-31 DIAGNOSIS — M899 Disorder of bone, unspecified: Principal | ICD-10-CM

## 2013-07-31 DIAGNOSIS — Z23 Encounter for immunization: Secondary | ICD-10-CM

## 2013-07-31 DIAGNOSIS — O9989 Other specified diseases and conditions complicating pregnancy, childbirth and the puerperium: Principal | ICD-10-CM

## 2013-07-31 DIAGNOSIS — Z3480 Encounter for supervision of other normal pregnancy, unspecified trimester: Secondary | ICD-10-CM

## 2013-07-31 DIAGNOSIS — Z3490 Encounter for supervision of normal pregnancy, unspecified, unspecified trimester: Secondary | ICD-10-CM

## 2013-07-31 DIAGNOSIS — M259 Joint disorder, unspecified: Secondary | ICD-10-CM

## 2013-07-31 LAB — POCT URINALYSIS DIP (DEVICE)
Bilirubin Urine: NEGATIVE
Glucose, UA: NEGATIVE mg/dL
Ketones, ur: NEGATIVE mg/dL
Nitrite: NEGATIVE
Protein, ur: NEGATIVE mg/dL
Specific Gravity, Urine: 1.02 (ref 1.005–1.030)
Urobilinogen, UA: 0.2 mg/dL (ref 0.0–1.0)
pH: 7 (ref 5.0–8.0)

## 2013-07-31 LAB — OB RESULTS CONSOLE GC/CHLAMYDIA
Chlamydia: NEGATIVE
GC PROBE AMP, GENITAL: NEGATIVE

## 2013-07-31 MED ORDER — NITROFURANTOIN MONOHYD MACRO 100 MG PO CAPS
ORAL_CAPSULE | ORAL | Status: DC
Start: 2013-07-31 — End: 2013-08-11

## 2013-07-31 NOTE — Progress Notes (Signed)
P=119,  C/o of a lot of pressure

## 2013-07-31 NOTE — Progress Notes (Signed)
Reports pelvic pressure.  No vaginal bleeding or abnormal discharge.  Continued large leuks in urine, asymptomatic.  Will treat with macrobid x 7 days then macrobid nightly (hx of multiple UTIs this pregnancy).  Cervix - closed, copious thick yellowish discharge noted while doing GBS and GC/CT screening > wet prep collected.

## 2013-08-01 LAB — GC/CHLAMYDIA PROBE AMP
CT Probe RNA: NEGATIVE
GC Probe RNA: NEGATIVE

## 2013-08-01 LAB — WET PREP, GENITAL
Clue Cells Wet Prep HPF POC: NONE SEEN
TRICH WET PREP: NONE SEEN

## 2013-08-03 ENCOUNTER — Other Ambulatory Visit: Payer: Self-pay | Admitting: Family

## 2013-08-03 LAB — CULTURE, OB URINE: Colony Count: >=100000

## 2013-08-07 ENCOUNTER — Ambulatory Visit (INDEPENDENT_AMBULATORY_CARE_PROVIDER_SITE_OTHER): Payer: Medicaid Other | Admitting: Family

## 2013-08-07 VITALS — BP 116/75 | Temp 97.2°F | Wt 174.1 lb

## 2013-08-07 DIAGNOSIS — M259 Joint disorder, unspecified: Secondary | ICD-10-CM

## 2013-08-07 DIAGNOSIS — M899 Disorder of bone, unspecified: Principal | ICD-10-CM

## 2013-08-07 DIAGNOSIS — O9989 Other specified diseases and conditions complicating pregnancy, childbirth and the puerperium: Principal | ICD-10-CM

## 2013-08-07 DIAGNOSIS — O0990 Supervision of high risk pregnancy, unspecified, unspecified trimester: Secondary | ICD-10-CM

## 2013-08-07 LAB — POCT URINALYSIS DIP (DEVICE)
Bilirubin Urine: NEGATIVE
Glucose, UA: NEGATIVE mg/dL
Ketones, ur: NEGATIVE mg/dL
NITRITE: NEGATIVE
PH: 7 (ref 5.0–8.0)
Protein, ur: NEGATIVE mg/dL
Specific Gravity, Urine: 1.02 (ref 1.005–1.030)
UROBILINOGEN UA: 0.2 mg/dL (ref 0.0–1.0)

## 2013-08-07 NOTE — Progress Notes (Signed)
P= 92 States baby is "moving less but still moving." Explained to pt. That this is most likely due to being towards end of pregnancy, less space for baby to move.  C/o of intermittent lower abdominal/pelvic pain and pressure.

## 2013-08-07 NOTE — Progress Notes (Signed)
Reviewed fetal movement and pt states baby is moving appropriate times, increased movement at this time (visible on abdomen).  Noticed decreased in past few weeks compared to earlier in pregnancy.  Reviewed normal fetal movement and provided reassurance.  Declined Diflucan due to being asymptomatic (large yeast with wet prep).  Pt reports spider veins on inner left leg, superficial > advised compression stockings.

## 2013-08-07 NOTE — Patient Instructions (Signed)
Buy leg compression stockings at drug store.

## 2013-08-11 ENCOUNTER — Inpatient Hospital Stay (HOSPITAL_COMMUNITY)
Admission: AD | Admit: 2013-08-11 | Discharge: 2013-08-11 | Disposition: A | Discharge status: Home / Self Care | Payer: Medicaid Other | Source: Ambulatory Visit | Attending: Obstetrics & Gynecology | Admitting: Obstetrics & Gynecology

## 2013-08-11 ENCOUNTER — Encounter (HOSPITAL_COMMUNITY): Payer: Self-pay | Admitting: *Deleted

## 2013-08-11 DIAGNOSIS — N949 Unspecified condition associated with female genital organs and menstrual cycle: Secondary | ICD-10-CM

## 2013-08-11 DIAGNOSIS — M419 Scoliosis, unspecified: Secondary | ICD-10-CM

## 2013-08-11 DIAGNOSIS — R102 Pelvic and perineal pain: Secondary | ICD-10-CM

## 2013-08-11 DIAGNOSIS — M545 Low back pain, unspecified: Secondary | ICD-10-CM | POA: Insufficient documentation

## 2013-08-11 DIAGNOSIS — O479 False labor, unspecified: Secondary | ICD-10-CM | POA: Insufficient documentation

## 2013-08-11 DIAGNOSIS — O26899 Other specified pregnancy related conditions, unspecified trimester: Secondary | ICD-10-CM

## 2013-08-11 DIAGNOSIS — O9989 Other specified diseases and conditions complicating pregnancy, childbirth and the puerperium: Secondary | ICD-10-CM

## 2013-08-11 DIAGNOSIS — O0990 Supervision of high risk pregnancy, unspecified, unspecified trimester: Secondary | ICD-10-CM

## 2013-08-11 DIAGNOSIS — M899 Disorder of bone, unspecified: Secondary | ICD-10-CM

## 2013-08-11 DIAGNOSIS — M259 Joint disorder, unspecified: Secondary | ICD-10-CM

## 2013-08-11 DIAGNOSIS — Z87891 Personal history of nicotine dependence: Secondary | ICD-10-CM | POA: Insufficient documentation

## 2013-08-11 LAB — CBC
HCT: 32.2 % — ABNORMAL LOW (ref 36.0–46.0)
Hemoglobin: 10.4 g/dL — ABNORMAL LOW (ref 12.0–15.0)
MCH: 27.8 pg (ref 26.0–34.0)
MCHC: 32.3 g/dL (ref 30.0–36.0)
MCV: 86.1 fL (ref 78.0–100.0)
PLATELETS: 273 10*3/uL (ref 150–400)
RBC: 3.74 MIL/uL — ABNORMAL LOW (ref 3.87–5.11)
RDW: 13.4 % (ref 11.5–15.5)
WBC: 11.6 10*3/uL — ABNORMAL HIGH (ref 4.0–10.5)

## 2013-08-11 LAB — BASIC METABOLIC PANEL
BUN: 6 mg/dL (ref 6–23)
CALCIUM: 8.4 mg/dL (ref 8.4–10.5)
CO2: 23 mEq/L (ref 19–32)
Chloride: 103 mEq/L (ref 96–112)
Creatinine, Ser: 0.57 mg/dL (ref 0.50–1.10)
GFR calc Af Amer: >90 mL/min (ref >90)
GFR calc non Af Amer: >90 mL/min (ref >90)
GLUCOSE: 86 mg/dL (ref 70–99)
Potassium: 3.9 mEq/L (ref 3.7–5.3)
SODIUM: 138 meq/L (ref 137–147)

## 2013-08-11 LAB — URINE MICROSCOPIC-ADD ON

## 2013-08-11 LAB — URINALYSIS, ROUTINE W REFLEX MICROSCOPIC
Bilirubin Urine: NEGATIVE
Glucose, UA: NEGATIVE mg/dL
Hgb urine dipstick: NEGATIVE
Ketones, ur: 15 mg/dL — AB
Nitrite: NEGATIVE
Protein, ur: NEGATIVE mg/dL
Specific Gravity, Urine: 1.015 (ref 1.005–1.030)
Urobilinogen, UA: 0.2 mg/dL (ref 0.0–1.0)
pH: 7 (ref 5.0–8.0)

## 2013-08-11 NOTE — MAU Note (Signed)
Having lots of back pain unsure if related to scoliosis.  Was not feeling baby move as much as normal, lower abdominal pain and pelvic pressure, no discharge or vaginal bleeding

## 2013-08-11 NOTE — Discharge Instructions (Signed)
Third Trimester of Pregnancy  The third trimester is from week 29 through week 42, months 7 through 9. The third trimester is a time when the fetus is growing rapidly. At the end of the ninth month, the fetus is about 20 inches in length and weighs 6 10 pounds.   BODY CHANGES  Your body goes through many changes during pregnancy. The changes vary from woman to woman.    Your weight will continue to increase. You can expect to gain 25 35 pounds (11 16 kg) by the end of the pregnancy.   You may begin to get stretch marks on your hips, abdomen, and breasts.   You may urinate more often because the fetus is moving lower into your pelvis and pressing on your bladder.   You may develop or continue to have heartburn as a result of your pregnancy.   You may develop constipation because certain hormones are causing the muscles that push waste through your intestines to slow down.   You may develop hemorrhoids or swollen, bulging veins (varicose veins).   You may have pelvic pain because of the weight gain and pregnancy hormones relaxing your joints between the bones in your pelvis. Back aches may result from over exertion of the muscles supporting your posture.   Your breasts will continue to grow and be tender. A yellow discharge may leak from your breasts called colostrum.   Your belly button may stick out.   You may feel short of breath because of your expanding uterus.   You may notice the fetus "dropping," or moving lower in your abdomen.   You may have a bloody mucus discharge. This usually occurs a few days to a week before labor begins.   Your cervix becomes thin and soft (effaced) near your due date.  WHAT TO EXPECT AT YOUR PRENATAL EXAMS   You will have prenatal exams every 2 weeks until week 36. Then, you will have weekly prenatal exams. During a routine prenatal visit:   You will be weighed to make sure you and the fetus are growing normally.   Your blood pressure is taken.   Your abdomen will be  measured to track your baby's growth.   The fetal heartbeat will be listened to.   Any test results from the previous visit will be discussed.   You may have a cervical check near your due date to see if you have effaced.  At around 36 weeks, your caregiver will check your cervix. At the same time, your caregiver will also perform a test on the secretions of the vaginal tissue. This test is to determine if a type of bacteria, Group B streptococcus, is present. Your caregiver will explain this further.  Your caregiver may ask you:   What your birth plan is.   How you are feeling.   If you are feeling the baby move.   If you have had any abnormal symptoms, such as leaking fluid, bleeding, severe headaches, or abdominal cramping.   If you have any questions.  Other tests or screenings that may be performed during your third trimester include:   Blood tests that check for low iron levels (anemia).   Fetal testing to check the health, activity level, and growth of the fetus. Testing is done if you have certain medical conditions or if there are problems during the pregnancy.  FALSE LABOR  You may feel small, irregular contractions that eventually go away. These are called Braxton Hicks contractions, or   false labor. Contractions may last for hours, days, or even weeks before true labor sets in. If contractions come at regular intervals, intensify, or become painful, it is best to be seen by your caregiver.   SIGNS OF LABOR    Menstrual-like cramps.   Contractions that are 5 minutes apart or less.   Contractions that start on the top of the uterus and spread down to the lower abdomen and back.   A sense of increased pelvic pressure or back pain.   A watery or bloody mucus discharge that comes from the vagina.  If you have any of these signs before the 37th week of pregnancy, call your caregiver right away. You need to go to the hospital to get checked immediately.  HOME CARE INSTRUCTIONS    Avoid all  smoking, herbs, alcohol, and unprescribed drugs. These chemicals affect the formation and growth of the baby.   Follow your caregiver's instructions regarding medicine use. There are medicines that are either safe or unsafe to take during pregnancy.   Exercise only as directed by your caregiver. Experiencing uterine cramps is a good sign to stop exercising.   Continue to eat regular, healthy meals.   Wear a good support bra for breast tenderness.   Do not use hot tubs, steam rooms, or saunas.   Wear your seat belt at all times when driving.   Avoid raw meat, uncooked cheese, cat litter boxes, and soil used by cats. These carry germs that can cause birth defects in the baby.   Take your prenatal vitamins.   Try taking a stool softener (if your caregiver approves) if you develop constipation. Eat more high-fiber foods, such as fresh vegetables or fruit and whole grains. Drink plenty of fluids to keep your urine clear or pale yellow.   Take warm sitz baths to soothe any pain or discomfort caused by hemorrhoids. Use hemorrhoid cream if your caregiver approves.   If you develop varicose veins, wear support hose. Elevate your feet for 15 minutes, 3 4 times a day. Limit salt in your diet.   Avoid heavy lifting, wear low heal shoes, and practice good posture.   Rest a lot with your legs elevated if you have leg cramps or low back pain.   Visit your dentist if you have not gone during your pregnancy. Use a soft toothbrush to brush your teeth and be gentle when you floss.   A sexual relationship may be continued unless your caregiver directs you otherwise.   Do not travel far distances unless it is absolutely necessary and only with the approval of your caregiver.   Take prenatal classes to understand, practice, and ask questions about the labor and delivery.   Make a trial run to the hospital.   Pack your hospital bag.   Prepare the baby's nursery.   Continue to go to all your prenatal visits as directed  by your caregiver.  SEEK MEDICAL CARE IF:   You are unsure if you are in labor or if your water has broken.   You have dizziness.   You have mild pelvic cramps, pelvic pressure, or nagging pain in your abdominal area.   You have persistent nausea, vomiting, or diarrhea.   You have a bad smelling vaginal discharge.   You have pain with urination.  SEEK IMMEDIATE MEDICAL CARE IF:    You have a fever.   You are leaking fluid from your vagina.   You have spotting or bleeding from your vagina.     You have severe abdominal cramping or pain.   You have rapid weight loss or gain.   You have shortness of breath with chest pain.   You notice sudden or extreme swelling of your face, hands, ankles, feet, or legs.   You have not felt your baby move in over an hour.   You have severe headaches that do not go away with medicine.   You have vision changes.  Document Released: 06/20/2001 Document Revised: 02/26/2013 Document Reviewed: 08/27/2012  [employer] Patient Information 2014 [employer]

## 2013-08-11 NOTE — MAU Provider Note (Signed)
Chief Complaint:  Labor Eval   Jennifer Chaney is a 23 y.o.  [phone] with IUP at 3453w0d presenting for Labor Eval  Pt here for pain, vaginal pressure and low back pain. Irregular.   This am had some low back pain.  No fevers, chills, nausea, vomiting, diarrhea. No VB, LOF.  +FM.      Menstrual History: OB History   Grav Para Term Preterm Abortions TAB SAB Ect Mult Living   4 2 2  1  1   2      G1- NSVD G2- SAB G3-  NSVD  Patient's last menstrual period was 11/02/2012.      Past Medical History  Diagnosis Date  . Preterm labor     pt-contractions with first pregnancy-del term    Past Surgical History  Procedure Laterality Date  . No past surgeries      No family history on file.  History  Substance Use Topics  . Smoking status: Former Games developermoker  . Smokeless tobacco: Never Used     Comment: Used to smoke hookah before pregnancy  . Alcohol Use: Yes     Comment: occasional before pregnancy     No Known Allergies  Facility-administered medications prior to admission  Medication Dose Route Frequency Provider Last Rate Last Dose  . Tdap (BOOSTRIX) injection 0.5 mL  0.5 mL Intramuscular Once Minta BalsamMichael R Odom, MD       No prescriptions prior to admission    Review of Systems - Negative except for what is mentioned in HPI.  Physical Exam  Blood pressure 116/68, pulse 100, temperature 98.1 F (36.7 C), temperature source Oral, resp. rate 18, height 5\' 7"  (1.702 m), weight 81.012 kg (178 lb 9.6 oz), last menstrual period 11/02/2012. GENERAL: Well-developed, well-nourished female in no acute distress.  LUNGS: Clear to auscultation bilaterally.  HEART: Regular rate and rhythm. ABDOMEN: Soft, nontender, nondistended, gravid.  EXTREMITIES: Nontender, no edema, 2+ distal pulses. Cervical Exam: Dilatation 2cm   Effacement 30%   Station -3   Presentation: cephalic FHT:  Baseline rate 130 bpm   Variability moderate  Accelerations present   Decelerations none Contractions:  occasional.   Labs: Results for orders placed during the hospital encounter of 08/11/13 (from the past 24 hour(s))  URINALYSIS, ROUTINE W REFLEX MICROSCOPIC   Collection Time    08/11/13  7:45 PM      Result Value Range   Color, Urine YELLOW  YELLOW   APPearance HAZY (*) CLEAR   Specific Gravity, Urine 1.015  1.005 - 1.030   pH 7.0  5.0 - 8.0   Glucose, UA NEGATIVE  NEGATIVE mg/dL   Hgb urine dipstick NEGATIVE  NEGATIVE   Bilirubin Urine NEGATIVE  NEGATIVE   Ketones, ur 15 (*) NEGATIVE mg/dL   Protein, ur NEGATIVE  NEGATIVE mg/dL   Urobilinogen, UA 0.2  0.0 - 1.0 mg/dL   Nitrite NEGATIVE  NEGATIVE   Leukocytes, UA LARGE (*) NEGATIVE  URINE MICROSCOPIC-ADD ON   Collection Time    08/11/13  7:45 PM      Result Value Range   Squamous Epithelial / LPF MANY (*) RARE   WBC, UA 3-6  <3 WBC/hpf   RBC / HPF 0-2  <3 RBC/hpf   Bacteria, UA FEW (*) RARE   Urine-Other MUCOUS PRESENT    CBC   Collection Time    08/11/13  8:03 PM      Result Value Range   WBC 11.6 (*) 4.0 - 10.5 K/uL   RBC  3.74 (*) 3.87 - 5.11 MIL/uL   Hemoglobin 10.4 (*) 12.0 - 15.0 g/dL   HCT 16.1 (*) 09.6 - 04.5 %   MCV 86.1  78.0 - 100.0 fL   MCH 27.8  26.0 - 34.0 pg   MCHC 32.3  30.0 - 36.0 g/dL   RDW 40.9  81.1 - 91.4 %   Platelets 273  150 - 400 K/uL  BASIC METABOLIC PANEL   Collection Time    08/11/13  8:03 PM      Result Value Range   Sodium 138  137 - 147 mEq/L   Potassium 3.9  3.7 - 5.3 mEq/L   Chloride 103  96 - 112 mEq/L   CO2 23  19 - 32 mEq/L   Glucose, Bld 86  70 - 99 mg/dL   BUN 6  6 - 23 mg/dL   Creatinine, Ser 7.82  0.50 - 1.10 mg/dL   Calcium 8.4  8.4 - 95.6 mg/dL   GFR calc non Af Amer >90  >90 mL/min   GFR calc Af Amer >90  >90 mL/min    Imaging Studies:  No results found.  Assessment: Jennifer Chaney is  23 y.o. [id number] at [redacted]w[redacted]d presents with Labor Eval .  Plan: No regular contraction pattern. Cervix unchanged from last check in clinic F/u as scheduled for [facility] Reassurance  given and labor precautions discussed  D/c home  FWB- cat I tracing.    Dervin Vore L 2/2/20159:11 PM

## 2013-08-12 NOTE — MAU Provider Note (Signed)
Attestation of Attending Supervision of Fellow: Evaluation and management procedures were performed by the Fellow under my supervision and collaboration.  I have reviewed the Fellow's note and chart, and I agree with the management and plan.    

## 2013-08-14 ENCOUNTER — Encounter: Payer: Medicaid Other | Admitting: Advanced Practice Midwife

## 2013-08-14 LAB — URINE CULTURE

## 2013-08-15 ENCOUNTER — Telehealth: Payer: Self-pay | Admitting: Nurse Practitioner

## 2013-08-15 DIAGNOSIS — N390 Urinary tract infection, site not specified: Secondary | ICD-10-CM

## 2013-08-15 MED ORDER — NITROFURANTOIN MONOHYD MACRO 100 MG PO CAPS: 100.0000 mg | ORAL_CAPSULE | Freq: Two times a day (BID) | ORAL | Status: DC

## 2013-08-15 NOTE — Telephone Encounter (Signed)
Pt was seen in MAU. Urine culture shows UTI. Will send Macrobid 100 mg BID x  7 days to pharmacy. Left message on husbands phone.

## 2013-08-18 ENCOUNTER — Encounter (HOSPITAL_COMMUNITY): Payer: Self-pay

## 2013-08-18 ENCOUNTER — Inpatient Hospital Stay (HOSPITAL_COMMUNITY)
Admission: AD | Admit: 2013-08-18 | Discharge: 2013-08-18 | Disposition: A | Discharge status: Home / Self Care | Payer: Medicaid Other | Source: Ambulatory Visit | Attending: Obstetrics and Gynecology | Admitting: Obstetrics and Gynecology

## 2013-08-18 DIAGNOSIS — Z87891 Personal history of nicotine dependence: Secondary | ICD-10-CM | POA: Insufficient documentation

## 2013-08-18 DIAGNOSIS — N390 Urinary tract infection, site not specified: Secondary | ICD-10-CM | POA: Insufficient documentation

## 2013-08-18 DIAGNOSIS — M899 Disorder of bone, unspecified: Secondary | ICD-10-CM

## 2013-08-18 DIAGNOSIS — M419 Scoliosis, unspecified: Secondary | ICD-10-CM

## 2013-08-18 DIAGNOSIS — B3731 Acute candidiasis of vulva and vagina: Secondary | ICD-10-CM | POA: Insufficient documentation

## 2013-08-18 DIAGNOSIS — O2390 Unspecified genitourinary tract infection in pregnancy, unspecified trimester: Secondary | ICD-10-CM | POA: Insufficient documentation

## 2013-08-18 DIAGNOSIS — M259 Joint disorder, unspecified: Secondary | ICD-10-CM

## 2013-08-18 DIAGNOSIS — B373 Candidiasis of vulva and vagina: Secondary | ICD-10-CM | POA: Insufficient documentation

## 2013-08-18 DIAGNOSIS — M79609 Pain in unspecified limb: Secondary | ICD-10-CM | POA: Insufficient documentation

## 2013-08-18 DIAGNOSIS — O9989 Other specified diseases and conditions complicating pregnancy, childbirth and the puerperium: Secondary | ICD-10-CM

## 2013-08-18 DIAGNOSIS — O0990 Supervision of high risk pregnancy, unspecified, unspecified trimester: Secondary | ICD-10-CM

## 2013-08-18 DIAGNOSIS — O479 False labor, unspecified: Secondary | ICD-10-CM | POA: Insufficient documentation

## 2013-08-18 DIAGNOSIS — M2550 Pain in unspecified joint: Secondary | ICD-10-CM | POA: Insufficient documentation

## 2013-08-18 LAB — URINALYSIS, ROUTINE W REFLEX MICROSCOPIC
BILIRUBIN URINE: NEGATIVE
Glucose, UA: NEGATIVE mg/dL
KETONES UR: NEGATIVE mg/dL
Nitrite: POSITIVE — AB
Protein, ur: NEGATIVE mg/dL
Specific Gravity, Urine: 1.015 (ref 1.005–1.030)
Urobilinogen, UA: 0.2 mg/dL (ref 0.0–1.0)
pH: 8 (ref 5.0–8.0)

## 2013-08-18 LAB — URINE MICROSCOPIC-ADD ON

## 2013-08-18 LAB — WET PREP, GENITAL
CLUE CELLS WET PREP: NONE SEEN
TRICH WET PREP: NONE SEEN

## 2013-08-18 LAB — POCT FERN TEST: POCT Fern Test: NEGATIVE

## 2013-08-18 LAB — OB RESULTS CONSOLE GBS: GBS: POSITIVE

## 2013-08-18 NOTE — MAU Note (Signed)
Pelvic pain and pressure, radiates down leg- esp left leg. Pain comes and goes, first started 2 days ago.no bleeding. ?leaking- states when she goes to the bathroom a lot of stuff comes out.

## 2013-08-18 NOTE — MAU Provider Note (Signed)
Attestation of Attending Supervision of Advanced Practitioner (CNM/NP): Evaluation and management procedures were performed by the Advanced Practitioner under my supervision and collaboration.  I have reviewed the Advanced Practitioner's note and chart, and I agree with the management and plan.  Muhanad Torosyan 08/18/2013 5:58 PM

## 2013-08-18 NOTE — MAU Note (Signed)
Pt states two days ago had leaking of fluid, none present now. Also began having upper thigh pain. Has never felt this pain with her prior pregnancies and came for eval. States legs almost feel numb at times when walking, and will sit down shortly after getting up. Denies bleeding. Cervical exam was 2/30% in clinic.

## 2013-08-18 NOTE — Discharge Instructions (Signed)

## 2013-08-18 NOTE — MAU Provider Note (Signed)
History   Jennifer Chaney is a 23 yo G4P2012 at 39.[redacted] weeks gestation.  She reported to MAU for upper leg/joint pain for the past 2 days and abdominal tightening that comes and goes.  The pain is present on both sides, but more pronounced on the right.  It starts as a sharp/shooting pain in the hip/pelvic region that continues down the leg, causing it to feel numb.  The pain is lessened with rest, but does not go away completely. Pertinent medical history includes a dx of scoliosis. The patient was recently diagnosed with a UTI and completed a 14 day course of Macrobid. Patient also reports a trickling of clear fluid 2 days ago. She denies VB and regular contraction.  CSN: [id number]  Arrival date and time: 08/18/13 1235   First Provider Initiated Contact with Patient 08/18/13 1324      Chief Complaint  Patient presents with  . Labor Eval  . Leg Pain   Leg Pain  The incident occurred 3 to 5 days ago. There was no injury mechanism. The pain is present in the left hip and right hip. The quality of the pain is described as shooting. The pain is at a severity of 6/10. The pain is moderate. The pain has been intermittent since onset. Associated symptoms include numbness and tingling. She reports no foreign bodies present. The symptoms are aggravated by movement. She has tried elevation and rest for the symptoms. The treatment provided mild relief.    Past Medical History  Diagnosis Date  . Preterm labor     pt-contractions with first pregnancy-del term  . Pregnancy induced hypertension     with second pregnancy    Past Surgical History  Procedure Laterality Date  . No past surgeries      History reviewed. No pertinent family history.  History  Substance Use Topics  . Smoking status: Former Games developer  . Smokeless tobacco: Never Used     Comment: Used to smoke hookah before pregnancy  . Alcohol Use: No     Comment: occasional before pregnancy    Allergies: No Known  Allergies  Facility-administered medications prior to admission  Medication Dose Route Frequency Provider Last Rate Last Dose  . Tdap (BOOSTRIX) injection 0.5 mL  0.5 mL Intramuscular Once Minta Balsam, MD       Prescriptions prior to admission  Medication Sig Dispense Refill  . nitrofurantoin, macrocrystal-monohydrate, (MACROBID) 100 MG capsule Take 1 capsule (100 mg total) by mouth 2 (two) times daily.  14 capsule  0    Review of Systems  HENT: Negative.   Eyes: Negative.   Respiratory: Negative.   Cardiovascular: Negative.   Gastrointestinal: Positive for abdominal pain.  Genitourinary:       Trickle of fluid 2 days ago  Musculoskeletal: Positive for joint pain.  Skin: Negative.   Neurological: Positive for tingling, weakness and numbness.  Endo/Heme/Allergies: Negative.   Psychiatric/Behavioral: Negative.    Physical Exam   Blood pressure 116/66, pulse 100, temperature 98.5 F (36.9 C), temperature source Oral, resp. rate 18, last menstrual period 11/02/2012, SpO2 99.00%, not currently breastfeeding.  Physical Exam  Constitutional: She is oriented to person, place, and time. She appears well-developed and well-nourished.  HENT:  Head: Normocephalic.  Eyes: Conjunctivae are normal.  Neck: Normal range of motion.  Cardiovascular: Normal rate and regular rhythm.   Respiratory: Effort normal.  GI: Soft. Bowel sounds are normal.  Genitourinary: Vaginal discharge found.  Gravid uterus Thick, white, curdled discharge  Musculoskeletal:  She exhibits tenderness.  Neurological: She is alert and oriented to person, place, and time.  Skin: Skin is warm and dry.  Psychiatric: She has a normal mood and affect. Her behavior is normal. Thought content normal.   Results for orders placed during the hospital encounter of 08/18/13 (from the past 24 hour(s))  URINALYSIS, ROUTINE W REFLEX MICROSCOPIC     Status: Abnormal   Collection Time    08/18/13 12:37 PM      Result Value  Range   Color, Urine YELLOW  YELLOW   APPearance CLEAR  CLEAR   Specific Gravity, Urine 1.015  1.005 - 1.030   pH 8.0  5.0 - 8.0   Glucose, UA NEGATIVE  NEGATIVE mg/dL   Hgb urine dipstick TRACE (*) NEGATIVE   Bilirubin Urine NEGATIVE  NEGATIVE   Ketones, ur NEGATIVE  NEGATIVE mg/dL   Protein, ur NEGATIVE  NEGATIVE mg/dL   Urobilinogen, UA 0.2  0.0 - 1.0 mg/dL   Nitrite POSITIVE (*) NEGATIVE   Leukocytes, UA LARGE (*) NEGATIVE  URINE MICROSCOPIC-ADD ON     Status: Abnormal   Collection Time    08/18/13 12:37 PM      Result Value Range   Squamous Epithelial / LPF RARE  RARE   WBC, UA 3-6  <3 WBC/hpf   RBC / HPF 0-2  <3 RBC/hpf   Bacteria, UA FEW (*) RARE   Urine-Other FEW YEAST    WET PREP, GENITAL     Status: Abnormal   Collection Time    08/18/13  1:35 PM      Result Value Range   Yeast Wet Prep HPF POC RARE (*) NONE SEEN   Trich, Wet Prep NONE SEEN  NONE SEEN   Clue Cells Wet Prep HPF POC NONE SEEN  NONE SEEN   WBC, Wet Prep HPF POC MANY (*) NONE SEEN     MAU Course  Procedures    Assessment and Plan  IUP at 39 weeks Ligament  Pain UTI Yeast Infection  Plan Discharge to home  [facility] Increase daily dosage of keflex for remainder of pregnancy [facility] 10 days  Jennifer Chaney, Jennifer 08/18/2013, 1:44 PM     Seen and examined by me also Cervix reported to be 1-2cm ext os, closed internal os Irregular contractions, patient does not appear uncomfortable with UCs  Has known UTI. Was prescribed both Keflex and Macrobid, but only filled the Keflex States got one shot of Rocephin Advised to start taking the Keflex as directed (has only been taking it once per day)  States wants to be induced like her last two babies Discussed no clinical indication and risk of C/S if we induce without cause [place] Travel warnings reviewed.   Wants membranes swept, will do today.   Jennifer Chaney,  CNM

## 2013-08-20 LAB — CULTURE, OB URINE
Colony Count: >=100000
Special Requests: NORMAL

## 2013-08-21 ENCOUNTER — Ambulatory Visit (INDEPENDENT_AMBULATORY_CARE_PROVIDER_SITE_OTHER): Payer: Medicaid Other | Admitting: Obstetrics & Gynecology

## 2013-08-21 ENCOUNTER — Encounter (HOSPITAL_COMMUNITY): Payer: Self-pay | Admitting: *Deleted

## 2013-08-21 ENCOUNTER — Encounter: Payer: Self-pay | Admitting: Family

## 2013-08-21 ENCOUNTER — Encounter (HOSPITAL_COMMUNITY): Payer: Medicaid Other | Admitting: Anesthesiology

## 2013-08-21 ENCOUNTER — Inpatient Hospital Stay (HOSPITAL_COMMUNITY)
Admission: AD | Admit: 2013-08-21 | Discharge: 2013-08-23 | DRG: 775 | Disposition: A | Discharge status: Home / Self Care | Payer: Medicaid Other | Source: Ambulatory Visit | Attending: Obstetrics & Gynecology | Admitting: Obstetrics & Gynecology

## 2013-08-21 ENCOUNTER — Inpatient Hospital Stay (HOSPITAL_COMMUNITY): Payer: Medicaid Other | Admitting: Anesthesiology

## 2013-08-21 VITALS — BP 124/73 | Temp 98.6°F | Wt 176.1 lb

## 2013-08-21 DIAGNOSIS — O9989 Other specified diseases and conditions complicating pregnancy, childbirth and the puerperium: Principal | ICD-10-CM

## 2013-08-21 DIAGNOSIS — M899 Disorder of bone, unspecified: Secondary | ICD-10-CM

## 2013-08-21 DIAGNOSIS — IMO0001 Reserved for inherently not codable concepts without codable children: Secondary | ICD-10-CM

## 2013-08-21 DIAGNOSIS — Z22330 Carrier of Group B streptococcus: Secondary | ICD-10-CM

## 2013-08-21 DIAGNOSIS — O99892 Other specified diseases and conditions complicating childbirth: Secondary | ICD-10-CM | POA: Diagnosis present

## 2013-08-21 DIAGNOSIS — O4290 Premature rupture of membranes, unspecified as to length of time between rupture and onset of labor, unspecified weeks of gestation: Principal | ICD-10-CM | POA: Diagnosis present

## 2013-08-21 DIAGNOSIS — O0990 Supervision of high risk pregnancy, unspecified, unspecified trimester: Secondary | ICD-10-CM

## 2013-08-21 DIAGNOSIS — Z23 Encounter for immunization: Secondary | ICD-10-CM

## 2013-08-21 DIAGNOSIS — M419 Scoliosis, unspecified: Secondary | ICD-10-CM

## 2013-08-21 DIAGNOSIS — M259 Joint disorder, unspecified: Secondary | ICD-10-CM

## 2013-08-21 DIAGNOSIS — Z87891 Personal history of nicotine dependence: Secondary | ICD-10-CM

## 2013-08-21 DIAGNOSIS — M4120 Other idiopathic scoliosis, site unspecified: Secondary | ICD-10-CM

## 2013-08-21 HISTORY — DX: Scoliosis, unspecified: M41.9

## 2013-08-21 LAB — TYPE AND SCREEN
ABO/RH(D): B POS
Antibody Screen: NEGATIVE

## 2013-08-21 LAB — CULTURE, BETA STREP (GROUP B ONLY)

## 2013-08-21 LAB — CBC
HEMATOCRIT: 30 % — AB (ref 36.0–46.0)
Hemoglobin: 10 g/dL — ABNORMAL LOW (ref 12.0–15.0)
MCH: 28 pg (ref 26.0–34.0)
MCHC: 33.3 g/dL (ref 30.0–36.0)
MCV: 84 fL (ref 78.0–100.0)
PLATELETS: 254 10*3/uL (ref 150–400)
RBC: 3.57 MIL/uL — ABNORMAL LOW (ref 3.87–5.11)
RDW: 13.8 % (ref 11.5–15.5)
WBC: 12.6 10*3/uL — ABNORMAL HIGH (ref 4.0–10.5)

## 2013-08-21 MED ORDER — CEFAZOLIN SODIUM 1-5 GM-% IV SOLN
1.0000 g | Freq: Three times a day (TID) | INTRAVENOUS | Status: DC
Start: 2013-08-21 — End: 2013-08-22
  Administered 2013-08-21 – 2013-08-22 (×2): 1 g via INTRAVENOUS
  Filled 2013-08-21 (×4): qty 50

## 2013-08-21 MED ORDER — PHENYLEPHRINE 40 MCG/ML (10ML) SYRINGE FOR IV PUSH (FOR BLOOD PRESSURE SUPPORT)
80.0000 ug | PREFILLED_SYRINGE | INTRAVENOUS | Status: DC | PRN
  Filled 2013-08-21: qty 10
  Filled 2013-08-21: qty 2

## 2013-08-21 MED ORDER — OXYTOCIN BOLUS FROM INFUSION: 500.0000 mL | INTRAVENOUS | Status: DC

## 2013-08-21 MED ORDER — EPHEDRINE 5 MG/ML INJ
10.0000 mg | INTRAVENOUS | Status: DC | PRN
  Filled 2013-08-21: qty 4
  Filled 2013-08-21: qty 2

## 2013-08-21 MED ORDER — EPHEDRINE 5 MG/ML INJ
10.0000 mg | INTRAVENOUS | Status: DC | PRN
  Filled 2013-08-21: qty 2

## 2013-08-21 MED ORDER — DEXTROSE 5 % IV SOLN
5.0000 10*6.[IU] | Freq: Once | INTRAVENOUS | Status: DC
  Filled 2013-08-21: qty 5

## 2013-08-21 MED ORDER — FENTANYL 2.5 MCG/ML BUPIVACAINE 1/10 % EPIDURAL INFUSION (WH - ANES)
14.0000 mL/h | INTRAMUSCULAR | Status: DC | PRN
  Administered 2013-08-21 – 2013-08-22 (×2): 14 mL/h via EPIDURAL
  Filled 2013-08-21 (×2): qty 125

## 2013-08-21 MED ORDER — LACTATED RINGERS IV SOLN
INTRAVENOUS | Status: DC
  Administered 2013-08-21: 23:00:00 via INTRAVENOUS

## 2013-08-21 MED ORDER — ZOLPIDEM TARTRATE 5 MG PO TABS: 5.0000 mg | ORAL_TABLET | Freq: Every evening | ORAL | Status: DC | PRN

## 2013-08-21 MED ORDER — CITRIC ACID-SODIUM CITRATE 334-500 MG/5ML PO SOLN: 30.0000 mL | ORAL | Status: DC | PRN

## 2013-08-21 MED ORDER — LIDOCAINE HCL (PF) 1 % IJ SOLN
30.0000 mL | INTRAMUSCULAR | Status: DC | PRN
  Filled 2013-08-21: qty 30

## 2013-08-21 MED ORDER — FENTANYL CITRATE 0.05 MG/ML IJ SOLN: 50.0000 ug | INTRAMUSCULAR | Status: DC | PRN

## 2013-08-21 MED ORDER — ACETAMINOPHEN 325 MG PO TABS: 650.0000 mg | ORAL_TABLET | ORAL | Status: DC | PRN

## 2013-08-21 MED ORDER — DIPHENHYDRAMINE HCL 50 MG/ML IJ SOLN
12.5000 mg | INTRAMUSCULAR | Status: DC | PRN
  Administered 2013-08-22: 12.5 mg via INTRAVENOUS
  Filled 2013-08-21: qty 1

## 2013-08-21 MED ORDER — OXYTOCIN 40 UNITS IN LACTATED RINGERS INFUSION - SIMPLE MED: 62.5000 mL/h | INTRAVENOUS | Status: DC

## 2013-08-21 MED ORDER — CEFAZOLIN (ANCEF) 1 G IV SOLR: 1.0000 g | INTRAVENOUS | Status: DC

## 2013-08-21 MED ORDER — ONDANSETRON HCL 4 MG/2ML IJ SOLN
4.0000 mg | Freq: Four times a day (QID) | INTRAMUSCULAR | Status: DC | PRN
Start: 2013-08-21 — End: 2013-08-22
  Administered 2013-08-21: 4 mg via INTRAVENOUS
  Filled 2013-08-21: qty 2

## 2013-08-21 MED ORDER — LACTATED RINGERS IV SOLN
500.0000 mL | Freq: Once | INTRAVENOUS | Status: AC
  Administered 2013-08-21: 500 mL via INTRAVENOUS

## 2013-08-21 MED ORDER — IBUPROFEN 600 MG PO TABS
600.0000 mg | ORAL_TABLET | Freq: Four times a day (QID) | ORAL | Status: DC | PRN
  Administered 2013-08-22: 600 mg via ORAL
  Filled 2013-08-21: qty 1

## 2013-08-21 MED ORDER — PHENYLEPHRINE 40 MCG/ML (10ML) SYRINGE FOR IV PUSH (FOR BLOOD PRESSURE SUPPORT)
80.0000 ug | PREFILLED_SYRINGE | INTRAVENOUS | Status: DC | PRN
  Filled 2013-08-21: qty 2

## 2013-08-21 MED ORDER — SODIUM BICARBONATE 8.4 % IV SOLN
INTRAVENOUS | Status: DC | PRN
  Administered 2013-08-21: 5 mL via EPIDURAL

## 2013-08-21 MED ORDER — OXYCODONE-ACETAMINOPHEN 5-325 MG PO TABS
1.0000 | ORAL_TABLET | ORAL | Status: DC | PRN
  Filled 2013-08-21: qty 1

## 2013-08-21 MED ORDER — PENICILLIN G POTASSIUM 5000000 UNITS IJ SOLR
2.5000 10*6.[IU] | INTRAMUSCULAR | Status: DC
  Filled 2013-08-21 (×2): qty 2.5

## 2013-08-21 MED ORDER — LACTATED RINGERS IV SOLN: 500.0000 mL | INTRAVENOUS | Status: DC | PRN

## 2013-08-21 MED ORDER — TERBUTALINE SULFATE 1 MG/ML IJ SOLN: 0.2500 mg | Freq: Once | INTRAMUSCULAR | Status: AC | PRN

## 2013-08-21 MED ORDER — OXYTOCIN 40 UNITS IN LACTATED RINGERS INFUSION - SIMPLE MED
1.0000 m[IU]/min | INTRAVENOUS | Status: DC
Start: 2013-08-22 — End: 2013-08-23
  Administered 2013-08-22: 1 m[IU]/min via INTRAVENOUS
  Filled 2013-08-21: qty 1000

## 2013-08-21 MED ORDER — FLEET ENEMA 7-19 GM/118ML RE ENEM: 1.0000 | ENEMA | RECTAL | Status: DC | PRN

## 2013-08-21 NOTE — H&P (Signed)
Attestation of Attending Supervision of Advanced Practitioner (PA/CNM/NP): Evaluation and management procedures were performed by the Advanced Practitioner under my supervision and collaboration.  I have reviewed the Advanced Practitioner's note and chart, and I agree with the management and plan.  Samhitha Rosen S, MD [facility] Attending 08/21/2013 11:12 PM   

## 2013-08-21 NOTE — H&P (Signed)
Jennifer Chaney is a 23 y.o. female [phone] with IUP at 8426w3d presenting for ROM/early labor. Pt states she has been having regular, every 3-4 minutes contractions, associated with none vaginal bleeding.  Membranes are ruptured, clear fluid at 1730 with active fetal movement.   [facility]  Prenatal History/Complications: 16 week loss after abdominal trauma (fell) Last baby ? GBS [place]   Past Medical History: Past Medical History  Diagnosis Date  . Preterm labor     pt-contractions with first pregnancy-del term  . Pregnancy induced hypertension     with second pregnancy    Past Surgical History: Past Surgical History  Procedure Laterality Date  . No past surgeries      Obstetrical History: OB History   Grav Para Term Preterm Abortions TAB SAB Ect Mult Living   4 2 2  1  1   2         Social History: History   Social History  . Marital Status: Married    Spouse Name: N/A    Number of Children: N/A  . Years of Education: N/A   Social History Main Topics  . Smoking status: Former Games developermoker  . Smokeless tobacco: Never Used     Comment: Used to smoke hookah before pregnancy  . Alcohol Use: No     Comment: occasional before pregnancy  . Drug Use: No  . Sexual Activity: Yes    Birth Control/ Protection: None   Other Topics Concern  . Not on file   Social History Narrative  . No narrative on file    Family History: No family history on file.  Allergies: No Known Allergies  Facility-administered medications prior to admission  Medication Dose Route Frequency Provider Last Rate Last Dose  . Tdap (BOOSTRIX) injection 0.5 mL  0.5 mL Intramuscular Once Minta BalsamMichael R Odom, MD       No prescriptions prior to admission     Prenatal Transfer Tool  Maternal Diabetes: No Genetic Screening: Declined Maternal Ultrasounds/Referrals: Normal Fetal Ultrasounds or other Referrals:  None Maternal Substance Abuse:  No Significant Maternal Medications:   None Significant Maternal Lab Results: Lab values include: Group B Strep positive   Blood pressure 121/61, pulse 106, temperature 98.6 F (37 C), temperature source Oral, resp. rate 18, last menstrual period 11/02/2012, SpO2 100.00%, not currently breastfeeding. General appearance: alert, cooperative and no distress Lungs: clear to auscultation bilaterally Heart: regular rate and rhythm Abdomen: soft, non-tender; bowel sounds normal Pelvic: grossly ruptured clear fluid Extremities: Homans sign is negative, no sign of DVT DTR's 2+ Presentation: cephalic Fetal monitoringBaseline: 130 bpm, Variability: Good {> 6 bpm), Accelerations: Reactive and Decelerations: Absent Uterine activity q 3-5 minutes CX:  3-4/80/-3      Prenatal labs: ABO, Rh: B/POS/-- (11/20 0943) Antibody: NEG (11/20 0943) Rubella:  immune RPR: NON REAC (11/20 0943)  HBsAg: NEGATIVE (11/20 0943)  HIV: NON REACTIVE (11/20 0943)  GBS:    1 hr Glucola 63 Genetic screening  Too late Anatomy US  normal   No results found for this or any previous visit (from the past 24 hour(s)).  Assessment: Jennifer Chaney is a 23 y.o. [phone] with an IUP at 4826w3d presenting for ROM/early labor  Plan: Admit GBS prophylaxis   CRESENZO-DISHMAN,Caran Storck 08/21/2013, 7:36 PM

## 2013-08-21 NOTE — Progress Notes (Signed)
Pt states she had diarrhea yesterday 

## 2013-08-21 NOTE — Progress Notes (Signed)
Contractions Q 3 min for 1 h, no VB or ROM. Cx posterior. She was given labor instructions and will go to MAU if labor pains continue or ROM.

## 2013-08-21 NOTE — Progress Notes (Signed)
P=106,  C/o having contractions every 3 minutes x 45 minutes.

## 2013-08-21 NOTE — MAU Note (Signed)
Pt states she water  broke 1730

## 2013-08-21 NOTE — Anesthesia Preprocedure Evaluation (Signed)
Anesthesia Evaluation  Patient identified by MRN, date of birth, ID band Patient awake    Reviewed: Allergy & Precautions, H&P , Patient's Chart, lab work & pertinent test results  Airway Mallampati: II TM Distance: >3 FB Neck ROM: full    Dental  (+) Teeth Intact   Pulmonary former smoker,  breath sounds clear to auscultation        Cardiovascular hypertension, Rhythm:regular Rate:Normal     Neuro/Psych    GI/Hepatic   Endo/Other    Renal/GU      Musculoskeletal   Abdominal   Peds  Hematology   Anesthesia Other Findings PIH prior; not now CLE w/o difficulty before      Reproductive/Obstetrics (+) Pregnancy                           Anesthesia Physical Anesthesia Plan  ASA: II  Anesthesia Plan: Epidural   Post-op Pain Management:    Induction:   Airway Management Planned:   Additional Equipment:   Intra-op Plan:   Post-operative Plan:   Informed Consent: I have reviewed the patients History and Physical, chart, labs and discussed the procedure including the risks, benefits and alternatives for the proposed anesthesia with the patient or authorized representative who has indicated his/her understanding and acceptance.   Dental Advisory Given  Plan Discussed with:   Anesthesia Plan Comments: (Labs checked- platelets confirmed with RN in room. Fetal heart tracing, per RN, reported to be stable enough for sitting procedure. Discussed epidural, and patient consents to the procedure:  included risk of possible headache,backache, failed block, allergic reaction, and nerve injury. This patient was asked if she had any questions or concerns before the procedure started.)        Anesthesia Quick Evaluation

## 2013-08-21 NOTE — Progress Notes (Signed)
   Jennifer Chaney is a 23 y.o. [id number] at [redacted]w[redacted]d  admitted for PROM  Subjective:  Comfortable with epidural Objective: BP 108/56  Pulse 84  Temp(Src) 98.5 F (36.9 C) (Oral)  Resp 18  Ht 5\' 7"  (1.702 m)  Wt 79.833 kg (176 lb)  BMI 27.56 kg/m2  SpO2 85%  LMP 11/02/2012    FHT:  FHR: 140 bpm, variability: moderate,  accelerations:  Present,  decelerations:  Absent UC:   regular, every 6 minutes SVE:   4-5/30/-3  Labs: Lab Results  Component Value Date   WBC 12.6* 08/21/2013   HGB 10.0* 08/21/2013   HCT 30.0* 08/21/2013   MCV 84.0 08/21/2013   PLT 254 08/21/2013    Assessment / Plan: PROM, inadequate labor Will augment with pitocin Labor: inadequate Fetal Wellbeing:  Category I Pain Control:  Epidural Anticipated MOD:  NSVD  Jennifer Chaney 08/21/2013, 11:52 PM

## 2013-08-21 NOTE — Patient Instructions (Signed)

## 2013-08-21 NOTE — Anesthesia Procedure Notes (Signed)

## 2013-08-22 ENCOUNTER — Encounter: Payer: Self-pay | Admitting: *Deleted

## 2013-08-22 ENCOUNTER — Encounter (HOSPITAL_COMMUNITY): Payer: Self-pay | Admitting: *Deleted

## 2013-08-22 DIAGNOSIS — O4290 Premature rupture of membranes, unspecified as to length of time between rupture and onset of labor, unspecified weeks of gestation: Secondary | ICD-10-CM

## 2013-08-22 LAB — RPR: RPR Ser Ql: NONREACTIVE

## 2013-08-22 LAB — ABO/RH: ABO/RH(D): B POS

## 2013-08-22 MED ORDER — OXYCODONE-ACETAMINOPHEN 5-325 MG PO TABS
1.0000 | ORAL_TABLET | ORAL | Status: DC | PRN
  Administered 2013-08-22 (×2): 1 via ORAL
  Administered 2013-08-22 – 2013-08-23 (×2): 2 via ORAL
  Filled 2013-08-22: qty 1
  Filled 2013-08-22 (×3): qty 2

## 2013-08-22 MED ORDER — SENNOSIDES-DOCUSATE SODIUM 8.6-50 MG PO TABS
2.0000 | ORAL_TABLET | ORAL | Status: DC
Start: 2013-08-23 — End: 2013-08-23
  Administered 2013-08-22: 2 via ORAL
  Filled 2013-08-22: qty 2

## 2013-08-22 MED ORDER — DIBUCAINE 1 % RE OINT: 1.0000 "application " | TOPICAL_OINTMENT | RECTAL | Status: DC | PRN

## 2013-08-22 MED ORDER — WITCH HAZEL-GLYCERIN EX PADS: 1.0000 "application " | MEDICATED_PAD | CUTANEOUS | Status: DC | PRN

## 2013-08-22 MED ORDER — FERROUS SULFATE 325 (65 FE) MG PO TABS
325.0000 mg | ORAL_TABLET | Freq: Two times a day (BID) | ORAL | Status: DC
  Administered 2013-08-22 – 2013-08-23 (×2): 325 mg via ORAL
  Filled 2013-08-22 (×2): qty 1

## 2013-08-22 MED ORDER — LANOLIN HYDROUS EX OINT: TOPICAL_OINTMENT | CUTANEOUS | Status: DC | PRN

## 2013-08-22 MED ORDER — DIPHENHYDRAMINE HCL 25 MG PO CAPS: 25.0000 mg | ORAL_CAPSULE | Freq: Four times a day (QID) | ORAL | Status: DC | PRN

## 2013-08-22 MED ORDER — SIMETHICONE 80 MG PO CHEW: 80.0000 mg | CHEWABLE_TABLET | ORAL | Status: DC | PRN

## 2013-08-22 MED ORDER — MEASLES, MUMPS & RUBELLA VAC ~~LOC~~ INJ: 0.5000 mL | INJECTION | Freq: Once | SUBCUTANEOUS | Status: DC

## 2013-08-22 MED ORDER — METHYLERGONOVINE MALEATE 0.2 MG/ML IJ SOLN: 0.2000 mg | INTRAMUSCULAR | Status: DC | PRN

## 2013-08-22 MED ORDER — BENZOCAINE-MENTHOL 20-0.5 % EX AERO: 1.0000 "application " | INHALATION_SPRAY | CUTANEOUS | Status: DC | PRN

## 2013-08-22 MED ORDER — METHYLERGONOVINE MALEATE 0.2 MG PO TABS: 0.2000 mg | ORAL_TABLET | ORAL | Status: DC | PRN

## 2013-08-22 MED ORDER — ONDANSETRON HCL 4 MG PO TABS: 4.0000 mg | ORAL_TABLET | ORAL | Status: DC | PRN

## 2013-08-22 MED ORDER — IBUPROFEN 600 MG PO TABS
600.0000 mg | ORAL_TABLET | Freq: Four times a day (QID) | ORAL | Status: DC
  Administered 2013-08-22 – 2013-08-23 (×3): 600 mg via ORAL
  Filled 2013-08-22 (×4): qty 1

## 2013-08-22 MED ORDER — PRENATAL MULTIVITAMIN CH
1.0000 | ORAL_TABLET | Freq: Every day | ORAL | Status: DC
  Administered 2013-08-23: 1 via ORAL
  Filled 2013-08-22: qty 1

## 2013-08-22 MED ORDER — BISACODYL 10 MG RE SUPP: 10.0000 mg | Freq: Every day | RECTAL | Status: DC | PRN

## 2013-08-22 MED ORDER — OXYTOCIN 40 UNITS IN LACTATED RINGERS INFUSION - SIMPLE MED: 62.5000 mL/h | INTRAVENOUS | Status: DC | PRN

## 2013-08-22 MED ORDER — TETANUS-DIPHTH-ACELL PERTUSSIS 5-2.5-18.5 LF-MCG/0.5 IM SUSP
0.5000 mL | Freq: Once | INTRAMUSCULAR | Status: AC
  Administered 2013-08-23: 0.5 mL via INTRAMUSCULAR

## 2013-08-22 MED ORDER — PRENATAL MULTIVITAMIN CH: 1.0000 | ORAL_TABLET | Freq: Every day | ORAL | Status: DC

## 2013-08-22 MED ORDER — ONDANSETRON HCL 4 MG/2ML IJ SOLN: 4.0000 mg | INTRAMUSCULAR | Status: DC | PRN

## 2013-08-22 NOTE — Lactation Note (Signed)
This note was copied from the chart of Jennifer Genese Paulette. Lactation Consultation Note     Initial consult with this mom of a term baby, now 618 hours old. Mom reports trying to breast feed, but the baby not sucking. She has been formula feeding the baby today. I explained that the formula was making the baby full and sleepy, and that if she wanted the baby to latch and breast feed, she should avoid the formula. I showed mom that with hand expression, she has lots of easily expressed colostrum, which she had been making for the baby over the past few months. I told her this colostrum was for her baby's protection from infection, and was all he needed for the first 2 days, and that her milk would come in at day 3. Mom smiled, seemed to understand. I told her to call for lactation help, via her nurse, if she needed help latching. Lactation services reviewed with mom l so.  Patient Name: Jennifer Chaney [id number]'WToday's Date: 08/22/2013 Reason for consult: Initial assessment   Maternal Data Formula Feeding for Exclusion: Yes Reason for exclusion: Mother's choice to formula and breast feed on admission Infant to breast within first hour of birth: No Breastfeeding delayed due to:: Maternal status Has patient been taught Hand Expression?: Yes Does the patient have breastfeeding experience prior to this delivery?: Yes  Feeding Feeding Type: Formula  LATCH Score/Interventions                      Lactation Tools Discussed/Used     Consult Status Consult Status: Follow-up Date: 08/23/13 Follow-up type: In-patient    Alfred LevinsLee, Riggins Cisek Anne 08/22/2013, 6:08 PM

## 2013-08-22 NOTE — Progress Notes (Signed)
Jennifer Chaney is a 23 y.o. [id number] at 6030w4d by LMP c/w US admitted for active labor, rupture of membranes (1730 on 2/12).  Subjective: Feeling constant pressure/ pain, though able to sleep through contractions.   Objective: BP 119/65  Pulse 105  Temp(Src) 98.7 F (37.1 C) (Oral)  Resp 18  Ht 5\' 7"  (1.702 m)  Wt 79.833 kg (176 lb)  BMI 27.56 kg/m2  SpO2 85%  LMP 11/02/2012     FHT:  FHR: 140 bpm, variability: moderate,  accelerations:  Present,  decelerations:  Absent UC:   regular, every 1-1.5 minutes  Dilation: Lip/rim Effacement (%): 90 Cervical Position: Middle Station: +1 Presentation: Vertex Exam by:: Dr Carrolyn MeiersFeeney/ S Nix RN  Labs: Lab Results  Component Value Date   WBC 12.6* 08/21/2013   HGB 10.0* 08/21/2013   HCT 30.0* 08/21/2013   MCV 84.0 08/21/2013   PLT 254 08/21/2013    Assessment / Plan: Augmentation of labor, progressing well. Good contraction pattern.   Labor: continue pitocin  Fetal Wellbeing:  Category I Pain Control:  Epidural I/D:  GBS positive- ancef Anticipated MOD:  NSVD  Jennifer Chaney, [name] 08/22/2013, 6:22 AM

## 2013-08-22 NOTE — Progress Notes (Signed)
UR completed 

## 2013-08-22 NOTE — Progress Notes (Signed)
Jennifer Chaney is a 23 y.o. [id number] at 3924w4d by LMP c/w US admitted for active labor, rupture of membranes  Subjective: Feeling discomfort, pressure with contractions.   Objective: BP 95/49  Pulse 75  Temp(Src) 98.3 F (36.8 C) (Oral)  Resp 18  Ht 5\' 7"  (1.702 m)  Wt 79.833 kg (176 lb)  BMI 27.56 kg/m2  SpO2 85%  LMP 11/02/2012      FHT:  FHR: 135 bpm, variability: moderate,  accelerations:  Present,  decelerations:  Absent UC:   regular, every 2-3 minutes  Dilation: 7 Effacement (%): 70 Cervical Position: Middle Station: -1 Presentation: Vertex Exam by:: Dr Alonna BucklerFeeney  Labs: Lab Results  Component Value Date   WBC 12.6* 08/21/2013   HGB 10.0* 08/21/2013   HCT 30.0* 08/21/2013   MCV 84.0 08/21/2013   PLT 254 08/21/2013    Assessment / Plan: Augmentation of labor, progressing well. Good contraction pattern.   Labor: continue pitocin  Preeclampsia:  n/a BP not elevated. Fetal Wellbeing:  Category I Pain Control:  Epidural I/D:  GBS positive- ancef Anticipated MOD:  NSVD  [name], Gaelen Brager L 08/22/2013, 3:44 AM

## 2013-08-22 NOTE — Anesthesia Postprocedure Evaluation (Signed)
  Anesthesia Post-op Note  Anesthesia Post Note  Patient: Jennifer Chaney  Procedure(s) Performed: * No procedures listed *  Anesthesia type: Epidural  Patient location: Mother/Baby  Post pain: Pain level controlled  Post assessment: Post-op Vital signs reviewed  Last Vitals:  Filed Vitals:   08/22/13 1010  BP: 116/60  Pulse: 80  Temp: 36.7 C  Resp: 20    Post vital signs: Reviewed  Level of consciousness:alert  Complications: No apparent anesthesia complications

## 2013-08-23 MED ORDER — IBUPROFEN 600 MG PO TABS: 600.0000 mg | ORAL_TABLET | Freq: Four times a day (QID) | ORAL | Status: DC | PRN

## 2013-08-23 MED ORDER — PRENATAL MULTIVITAMIN CH: 1.0000 | ORAL_TABLET | Freq: Every day | ORAL | Status: DC

## 2013-08-23 NOTE — Progress Notes (Signed)
Discharge instructions provided to patient and significant other at bedside.  Activity, medications, follow up instructions, when to call the doctor and community resources discussed.  No questions at this time.  Patient left unit in stable condition with all personal belongings accompanied by staff.  Osvaldo AngstK. Cayla Wiegand, RN--------------

## 2013-08-23 NOTE — Discharge Summary (Signed)
Obstetric Discharge Summary Reason for Admission: onset of labor and rupture of membranes Prenatal Procedures: ultrasound Intrapartum Procedures: spontaneous vaginal delivery and GBS prophylaxis Postpartum Procedures: none Complications-Operative and Postpartum: none Eating, drinking, voiding, ambulating well.  +flatus.  Lochia and pain wnl.  Denies dizziness, lightheadedness, or sob. No complaints. Desires early d/c  Hemoglobin  Date Value Ref Range Status  08/21/2013 10.0* 12.0 - 15.0 g/dL Final     HCT  Date Value Ref Range Status  08/21/2013 30.0* 36.0 - 46.0 % Final    Physical Exam:  General: alert, cooperative and no distress Lochia: appropriate Uterine Fundus: firm Incision: n/a DVT Evaluation: No evidence of DVT seen on physical exam. Negative Homan's sign. No cords or calf tenderness. No significant calf/ankle edema.  Discharge Diagnoses: Term Pregnancy-delivered  Discharge Information: Date: 08/23/2013 Activity: pelvic rest Diet: routine Medications: PNV and Ibuprofen Condition: stable Instructions: refer to practice specific booklet Discharge to: home Follow-up Information   Schedule an appointment as soon as possible for a [facility] (4-6 weeks for postpartum visit)    Specialty:  Obstetrics and Gynecology   Contact information:   [address] [place] [id number] [phone]      Newborn Data: Live born female  Birth Weight: 7 lb 3.3 oz (3270 g) APGAR: 9, 9  Home with mother pending d/c from pediatrician. Breastfeeding Undecided about contraception- reports conceived w/ mirena in. Thinking about mirena vs. BTL   Jennifer DuncansBooker, Jennifer Chaney 08/23/2013, 8:46 AM

## 2013-08-23 NOTE — Discharge Instructions (Signed)
Postpartum Care After Vaginal Delivery °After you deliver your newborn (postpartum period), the usual stay in the hospital is 24 72 hours. If there were problems with your labor or delivery, or if you have other medical problems, you might be in the hospital longer.  °While you are in the hospital, you will receive help and instructions on how to care for yourself and your newborn during the postpartum period.  °While you are in the hospital: °· Be sure to tell your nurses if you have pain or discomfort, as well as where you feel the pain and what makes the pain worse. °· If you had an incision made near your vagina (episiotomy) or if you had some tearing during delivery, the nurses may put ice packs on your episiotomy or tear. The ice packs may help to reduce the pain and swelling. °· If you are breastfeeding, you may feel uncomfortable contractions of your uterus for a couple of weeks. This is normal. The contractions help your uterus get back to normal size. °· It is normal to have some bleeding after delivery. °· For the first 1 3 days after delivery, the flow is red and the amount may be similar to a period. °· It is common for the flow to start and stop. °· In the first few days, you may pass some small clots. Let your nurses know if you begin to pass large clots or your flow increases. °· Do not  flush blood clots down the toilet before having the nurse look at them. °· During the next 3 10 days after delivery, your flow should become more watery and pink or brown-tinged in color. °· Ten to fourteen days after delivery, your flow should be a small amount of yellowish-white discharge. °· The amount of your flow will decrease over the first few weeks after delivery. Your flow may stop in 6 8 weeks. Most women have had their flow stop by 12 weeks after delivery. °· You should change your sanitary pads frequently. °· Wash your hands thoroughly with soap and water for at least 20 seconds after changing pads, using  the toilet, or before holding or feeding your newborn. °· You should feel like you need to empty your bladder within the first 6 8 hours after delivery. °· In case you become weak, lightheaded, or faint, call your nurse before you get out of bed for the first time and before you take a shower for the first time. °· Within the first few days after delivery, your breasts may begin to feel tender and full. This is called engorgement. Breast tenderness usually goes away within 48 72 hours after engorgement occurs. You may also notice milk leaking from your breasts. If you are not breastfeeding, do not stimulate your breasts. Breast stimulation can make your breasts produce more milk. °· Spending as much time as possible with your newborn is very important. During this time, you and your newborn can feel close and get to know each other. Having your newborn stay in your room (rooming in) will help to strengthen the bond with your newborn.  It will give you time to get to know your newborn and become comfortable caring for your newborn. °· Your hormones change after delivery. Sometimes the hormone changes can temporarily cause you to feel sad or tearful. These feelings should not last more than a few days. If these feelings last longer than that, you should talk to your caregiver. °· If desired, talk to your caregiver about   methods of family planning or contraception. °· Talk to your caregiver about immunizations. Your caregiver may want you to have the following immunizations before leaving the hospital: °· Tetanus, diphtheria, and pertussis (Tdap) or tetanus and diphtheria (Td) immunization. It is very important that you and your family (including grandparents) or others caring for your newborn are up-to-date with the Tdap or Td immunizations. The Tdap or Td immunization can help protect your newborn from getting ill. °· Rubella immunization. °· Varicella (chickenpox) immunization. °· Influenza immunization. You should  receive this annual immunization if you did not receive the immunization during your pregnancy. °Document Released: 04/23/2007 Document Revised: 03/20/2012 Document Reviewed: 02/21/2012 °ExitCare® Patient Information ©2014 [employer]. ° °Breastfeeding °Deciding to breastfeed is one of the best choices you can make for you and your baby. A change in hormones during pregnancy causes your breast tissue to grow and increases the number and size of your milk ducts. These hormones also allow proteins, sugars, and fats from your blood supply to make breast milk in your milk-producing glands. Hormones prevent breast milk from being released before your baby is born as well as prompt milk flow after birth. Once breastfeeding has begun, thoughts of your baby, as well as his or her sucking or crying, can stimulate the release of milk from your milk-producing glands.  °BENEFITS OF BREASTFEEDING °For Your Baby °· Your first milk (colostrum) helps your baby's digestive system function better.   °· There are antibodies in your milk that help your baby fight off infections.   °· Your baby has a lower incidence of asthma, allergies, and sudden infant death syndrome.   °· The nutrients in breast milk are better for your baby than infant formulas and are designed uniquely for your baby's needs.   °· Breast milk improves your baby's brain development.   °· Your baby is less likely to develop other conditions, such as childhood obesity, asthma, or type 2 diabetes mellitus.   °For You  °· Breastfeeding helps to create a very special bond between you and your baby.   °· Breastfeeding is convenient. Breast milk is always available at the correct temperature and costs nothing.   °· Breastfeeding helps to burn calories and helps you lose the weight gained during pregnancy.   °· Breastfeeding makes your uterus contract to its prepregnancy size faster and slows bleeding (lochia) after you give birth.   °· Breastfeeding helps to lower your  risk of developing type 2 diabetes mellitus, osteoporosis, and breast or ovarian cancer later in life. °SIGNS THAT YOUR BABY IS HUNGRY °Early Signs of Hunger  °· Increased alertness or activity. °· Stretching. °· Movement of the head from side to side. °· Movement of the head and opening of the mouth when the corner of the mouth or cheek is stroked (rooting). °· Increased sucking sounds, smacking lips, cooing, sighing, or squeaking. °· Hand-to-mouth movements. °· Increased sucking of fingers or hands. °Late Signs of Hunger °· Fussing. °· Intermittent crying. °Extreme Signs of Hunger °Signs of extreme hunger will require calming and consoling before your baby will be able to breastfeed successfully. Do not wait for the following signs of extreme hunger to occur before you initiate breastfeeding:   °· Restlessness. °· A loud, strong cry. °·  Screaming. °BREASTFEEDING BASICS °Breastfeeding Initiation °· Find a comfortable place to sit or lie down, with your neck and back well supported. °· Place a pillow or rolled up blanket under your baby to bring him or her to the level of your breast (if you are seated). Nursing pillows are   specially designed to help support your arms and your baby while you breastfeed. °· Make sure that your baby's abdomen is facing your abdomen.   °· Gently massage your breast. With your fingertips, massage from your chest wall toward your nipple in a circular motion. This encourages milk flow. You may need to continue this action during the feeding if your milk flows slowly. °· Support your breast with 4 fingers underneath and your thumb above your nipple. Make sure your fingers are well away from your nipple and your baby's mouth.   °· Stroke your baby's lips gently with your finger or nipple.   °· When your baby's mouth is open wide enough, quickly bring your baby to your breast, placing your entire nipple and as much of the colored area around your nipple (areola) as possible into your baby's  mouth.   °· More areola should be visible above your baby's upper lip than below the lower lip.   °· Your baby's tongue should be between his or her lower gum and your breast.   °· Ensure that your baby's mouth is correctly positioned around your nipple (latched). Your baby's lips should create a seal on your breast and be turned out (everted). °· It is common for your baby to suck about 2 3 minutes in order to start the flow of breast milk. °Latching °Teaching your baby how to latch on to your breast properly is very important. An improper latch can cause nipple pain and decreased milk supply for you and poor weight gain in your baby. Also, if your baby is not latched onto your nipple properly, he or she may swallow some air during feeding. This can make your baby fussy. Burping your baby when you switch breasts during the feeding can help to get rid of the air. However, teaching your baby to latch on properly is still the best way to prevent fussiness from swallowing air while breastfeeding. °Signs that your baby has successfully latched on to your nipple:    °· Silent tugging or silent sucking, without causing you pain.   °· Swallowing heard between every 3 4 sucks.   °·  Muscle movement above and in front of his or her ears while sucking.   °Signs that your baby has not successfully latched on to nipple:  °· Sucking sounds or smacking sounds from your baby while breastfeeding. °· Nipple pain. °If you think your baby has not latched on correctly, slip your finger into the corner of your baby's mouth to break the suction and place it between your baby's gums. Attempt breastfeeding initiation again. °Signs of Successful Breastfeeding °Signs from your baby:   °· A gradual decrease in the number of sucks or complete cessation of sucking.   °· Falling asleep.   °· Relaxation of his or her body.   °· Retention of a small amount of milk in his or her mouth.   °· Letting go of your breast by himself or herself. °Signs  from you: °· Breasts that have increased in firmness, weight, and size 1 3 hours after feeding.   °· Breasts that are softer immediately after breastfeeding. °· Increased milk volume, as well as a change in milk consistency and color by the 5th day of breastfeeding.   °· Nipples that are not sore, cracked, or bleeding. °Signs That Your Baby is Getting Enough Milk °· Wetting at least 3 diapers in a 24-hour period. The urine should be clear and pale yellow by age 5 days. °· At least 3 stools in a 24-hour period by age 5 days. The stool   should be soft and yellow. °· At least 3 stools in a 24-hour period by age 7 days. The stool should be seedy and yellow. °· No loss of weight greater than 10% of birth weight during the first 3 days of age. °· Average weight gain of 4 7 ounces (120 210 mL) per week after age 4 days. °· Consistent daily weight gain by age 5 days, without weight loss after the age of 2 weeks. °After a feeding, your baby may spit up a small amount. This is common. °BREASTFEEDING FREQUENCY AND DURATION °Frequent feeding will help you make more milk and can prevent sore nipples and breast engorgement. Breastfeed when you feel the need to reduce the fullness of your breasts or when your baby shows signs of hunger. This is called "breastfeeding on demand." Avoid introducing a pacifier to your baby while you are working to establish breastfeeding (the first 4 6 weeks after your baby is born). After this time you may choose to use a pacifier. Research has shown that pacifier use during the first year of a baby's life decreases the risk of sudden infant death syndrome (SIDS). °Allow your baby to feed on each breast as long as he or she wants. Breastfeed until your baby is finished feeding. When your baby unlatches or falls asleep while feeding from the first breast, offer the second breast. Because newborns are often sleepy in the first few weeks of life, you may need to awaken your baby to get him or her to  feed. °Breastfeeding times will vary from baby to baby. However, the following rules can serve as a guide to help you ensure that your baby is properly fed: °· Newborns (babies 4 weeks of age or younger) may breastfeed every 1 3 hours. °· Newborns should not go longer than 3 hours during the day or 5 hours during the night without breastfeeding. °· You should breastfeed your baby a minimum of 8 times in a 24-hour period until you begin to introduce solid foods to your baby at around 6 months of age. °BREAST MILK PUMPING °Pumping and storing breast milk allows you to ensure that your baby is exclusively fed your breast milk, even at times when you are unable to breastfeed. This is especially important if you are going back to work while you are still breastfeeding or when you are not able to be present during feedings. Your lactation consultant can give you guidelines on how long it is safe to store breast milk.  °A breast pump is a machine that allows you to pump milk from your breast into a sterile bottle. The pumped breast milk can then be stored in a refrigerator or freezer. Some breast pumps are operated by hand, while others use electricity. Ask your lactation consultant which type will work best for you. Breast pumps can be purchased, but some hospitals and breastfeeding support groups lease breast pumps on a monthly basis. A lactation consultant can teach you how to hand express breast milk, if you prefer not to use a pump.  °CARING FOR YOUR BREASTS WHILE YOU BREASTFEED °Nipples can become dry, cracked, and sore while breastfeeding. The following recommendations can help keep your breasts moisturized and healthy: °· Avoid using soap on your nipples.   °· Wear a supportive bra. Although not required, special nursing bras and tank tops are designed to allow access to your breasts for breastfeeding without taking off your entire bra or top. Avoid wearing underwire style bras or extremely tight bras. °·   Air dry  your nipples for 3 4 minutes after each feeding.   °· Use only cotton bra pads to absorb leaked breast milk. Leaking of breast milk between feedings is normal.   °· Use lanolin on your nipples after breastfeeding. Lanolin helps to maintain your skin's normal moisture barrier. If you use pure lanolin you do not need to wash it off before feeding your baby again. Pure lanolin is not toxic to your baby. You may also hand express a few drops of breast milk and gently massage that milk into your nipples and allow the milk to air dry. °In the first few weeks after giving birth, some women experience extremely full breasts (engorgement). Engorgement can make your breasts feel heavy, warm, and tender to the touch. Engorgement peaks within 3 5 days after you give birth. The following recommendations can help ease engorgement: °· Completely empty your breasts while breastfeeding or pumping. You may want to start by applying warm, moist heat (in the shower or with warm water-soaked hand towels) just before feeding or pumping. This increases circulation and helps the milk flow. If your baby does not completely empty your breasts while breastfeeding, pump any extra milk after he or she is finished. °· Wear a snug bra (nursing or regular) or tank top for 1 2 days to signal your body to slightly decrease milk production. °· Apply ice packs to your breasts, unless this is too uncomfortable for you. °· Make sure that your baby is latched on and positioned properly while breastfeeding. °If engorgement persists after 48 hours of following these recommendations, contact your health care provider or a lactation consultant. °OVERALL HEALTH CARE RECOMMENDATIONS WHILE BREASTFEEDING °· Eat healthy foods. Alternate between meals and snacks, eating 3 of each per day. Because what you eat affects your breast milk, some of the foods may make your baby more irritable than usual. Avoid eating these foods if you are sure that they are negatively  affecting your baby. °· Drink milk, fruit juice, and water to satisfy your thirst (about 10 glasses a day).   °· Rest often, relax, and continue to take your prenatal vitamins to prevent fatigue, stress, and anemia. °· Continue breast self-awareness checks. °· Avoid chewing and smoking tobacco. °· Avoid alcohol and drug use. °Some medicines that may be harmful to your baby can pass through breast milk. It is important to ask your health care provider before taking any medicine, including all over-the-counter and prescription medicine as well as vitamin and herbal supplements. °It is possible to become pregnant while breastfeeding. If birth control is desired, ask your health care provider about options that will be safe for your baby. °SEEK MEDICAL CARE IF:  °· You feel like you want to stop breastfeeding or have become frustrated with breastfeeding. °· You have painful breasts or nipples. °· Your nipples are cracked or bleeding. °· Your breasts are red, tender, or warm. °· You have a swollen area on either breast. °· You have a fever or chills. °· You have nausea or vomiting. °· You have drainage other than breast milk from your nipples. °· Your breasts do not become full before feedings by the 5th day after you give birth. °· You feel sad and depressed. °· Your baby is too sleepy to eat well. °· Your baby is having trouble sleeping.   °· Your baby is wetting less than 3 diapers in a 24-hour period. °· Your baby has less than 3 stools in a 24-hour period. °· Your baby's skin or the   white part of his or her eyes becomes yellow.   °· Your baby is not gaining weight by 5 days of age. °SEEK IMMEDIATE MEDICAL CARE IF:  °· Your baby is overly tired (lethargic) and does not want to wake up and feed. °· Your baby develops an unexplained fever. °Document Released: 06/26/2005 Document Revised: 02/26/2013 Document Reviewed: 12/18/2012 °ExitCare® Patient Information ©2014 [employer]. ° ° °

## 2013-08-27 ENCOUNTER — Encounter: Payer: Self-pay | Admitting: *Deleted

## 2013-10-08 ENCOUNTER — Telehealth: Payer: Self-pay

## 2013-10-08 ENCOUNTER — Ambulatory Visit: Payer: Medicaid Other | Admitting: Obstetrics & Gynecology

## 2013-10-08 NOTE — Telephone Encounter (Signed)
Called pt concerning missed pp appt and pt informed me that she was going to call but [place].  Pt stated that she would need to call the clinics back in an hour to reschedule her pp appt.

## 2013-12-19 ENCOUNTER — Ambulatory Visit: Payer: Medicaid Other | Admitting: Obstetrics & Gynecology

## 2013-12-25 ENCOUNTER — Other Ambulatory Visit: Payer: Self-pay

## 2013-12-25 MED ORDER — NORGESTIM-ETH ESTRAD TRIPHASIC 0.18/0.215/0.25 MG-25 MCG PO TABS: 1.0000 | ORAL_TABLET | Freq: Every day | ORAL | Status: DC

## 2013-12-25 NOTE — Progress Notes (Signed)
Pt came to front desk window and asked if she could get a supply of BCP's until her appt on July 31st in which is for a IUD insertion.  I asked pt when was the last time she was sexually active pt informed me that she was last sexually active last night with a condom.  Pt also informed me that she uses her sisters pills.  I advised pt to not take someone else medication that was not meant for her to take it could be harmful to her.  Pt stated understanding.  Called Dr. Debroah LoopArnold, [name], and informed him of the above request from the patient.  Dr. Debroah LoopArnold agreed to one pack of Tri-Sprintec with one refill to last her until her appt. Sent medication to pt pharmacy, [facility] off [address] And [address].

## 2014-02-06 ENCOUNTER — Ambulatory Visit (INDEPENDENT_AMBULATORY_CARE_PROVIDER_SITE_OTHER): Payer: Medicaid Other | Admitting: Nurse Practitioner

## 2014-02-06 ENCOUNTER — Encounter: Payer: Self-pay | Admitting: Nurse Practitioner

## 2014-02-06 VITALS — BP 106/62 | HR 83 | Temp 98.4°F | Ht 67.0 in | Wt 140.2 lb

## 2014-02-06 DIAGNOSIS — Z309 Encounter for contraceptive management, unspecified: Secondary | ICD-10-CM | POA: Insufficient documentation

## 2014-02-06 DIAGNOSIS — Z3049 Encounter for surveillance of other contraceptives: Secondary | ICD-10-CM

## 2014-02-06 LAB — POCT PREGNANCY, URINE: Preg Test, Ur: NEGATIVE

## 2014-02-06 MED ORDER — NORETHIN ACE-ETH ESTRAD-FE 1-20 MG-MCG(24) PO TABS
1.0000 | ORAL_TABLET | Freq: Every day | ORAL | Status: DC
Start: 2014-02-06 — End: 2014-02-12

## 2014-02-06 NOTE — Patient Instructions (Signed)
Contraception Choices  Birth control (contraception) is the use of any methods or devices to stop pregnancy from happening. Below are some methods to help avoid pregnancy.  HORMONAL BIRTH CONTROL  · A small tube put under the skin of the upper arm (implant). The tube can stay in place for 3 years. The implant must be taken out after 3 years.  · Shots given every 3 months.  · Pills taken every day.  · Patches that are changed once a week.  · A ring put into the vagina (vaginal ring). The ring is left in place for 3 weeks and removed for 1 week. Then, a new ring is put in the vagina.  · Emergency birth control pills taken after unprotected sex (intercourse).  BARRIER BIRTH CONTROL   · A thin covering worn on the penis (female condom) during sex.  · A soft, loose covering put into the vagina (female condom) before sex.  · A rubber bowl that sits over the cervix (diaphragm). The bowl must be made for you. The bowl is put into the vagina before sex. The bowl is left in place for 6 to 8 hours after sex.  · A small, soft cup that fits over the cervix (cervical cap). The cup must be made for you. The cup can be left in place for 48 hours after sex.  · A sponge that is put into the vagina before sex.  · A chemical that kills or stops sperm from getting into the cervix and uterus (spermicide). The chemical may be a cream, jelly, foam, or pill.  INTRAUTERINE (IUD) BIRTH CONTROL   · IUD birth control is a small, T-shaped piece of plastic. The plastic is put inside the uterus. There are 2 types of IUD:  ¨ Copper IUD. The IUD is covered in copper wire. The copper makes a fluid that kills sperm. It can stay in place for 10 years.  ¨ Hormone IUD. The hormone stops pregnancy from happening. It can stay in place for 5 years.  PERMANENT METHODS  · When the woman has her fallopian tubes sealed, tied, or blocked during surgery. This stops the egg from traveling to the uterus.  · The doctor places a small coil or insert into each fallopian  tube. This causes scar tissue to form and blocks the fallopian tubes.  · When the female has the tubes that carry sperm tied off (vasectomy).  NATURAL FAMILY PLANNING BIRTH CONTROL   · Natural family planning means not having sex or using barrier birth control on the days the woman could become pregnant.  · Use a calendar to keep track of the length of each period and know the days she can get pregnant.  · Avoid sex during ovulation.  · Use a thermometer to measure body temperature. Also watch for symptoms of ovulation.  · Time sex to be after the woman has ovulated.  Use condoms to help protect yourself against sexually transmitted infections (STIs). Do this no matter what type of birth control you use. Talk to your doctor about which type of birth control is best for you.  Document Released: 04/23/2009 Document Revised: 07/01/2013 Document Reviewed: 01/15/2013  ExitCare® Patient Information ©2015 [employer]. This information is not intended to replace advice given to you by your health care provider. Make sure you discuss any questions you have with your health care provider.

## 2014-02-06 NOTE — Progress Notes (Signed)
Here today for birth control. Never got postpartum visit. Delivered in February. Wants to switch to inexpensive BCP and fill out mirena application because no insurance. Has been sexually active without contraception this week. Finished bcp in June

## 2014-02-06 NOTE — Progress Notes (Signed)
History:  Jennifer Chaney is a 23 y.o. [phone] who [facility] today for contraception. She has been given Sprintec in past and it was too expensive and she would like a lower dose pill. She is without insurance. She is sexually active with same partner. She will have her menses this weekend. She is not planning to have any more children and may consider an IUD in the future.  The following portions of the patient's history were reviewed and updated as appropriate: allergies, current medications, past family history, past medical history, past social history, past surgical history and problem list.  Review of Systems:  Pertinent items are noted in HPI.  Objective:  Physical Exam BP 106/62  Pulse 83  Temp(Src) 98.4 F (36.9 C)  Ht 5\' 7"  (1.702 m)  Wt 140 lb 3.2 oz (63.594 kg)  BMI 21.95 kg/m2  LMP 01/11/2014  Breastfeeding? No GENERAL: Well-developed, well-nourished female in no acute distress.  HEENT: Normocephalic, atraumatic.  NECK: Supple. Normal thyroid.  LUNGS: Normal rate. Clear to auscultation bilaterally.  HEART: Regular rate and rhythm with no adventitious sounds.  EXTREMITIES: No cyanosis, clubbing, or edema, 2+ distal pulses.   Labs and Imaging No results found.  Assessment & Plan:  Assessment:  Contraception  Plans:  Will change BCPs to Loestrin 1/20 daily. Start Sunday after menses and use condoms for one package. Follow up as needed  Delbert PhenixLinda M Darlyne Schmiesing, NP 02/06/2014 4:35 PM

## 2014-02-12 ENCOUNTER — Other Ambulatory Visit: Payer: Self-pay

## 2014-02-12 MED ORDER — NORGESTIMATE-ETH ESTRADIOL 0.25-35 MG-MCG PO TABS: 1.0000 | ORAL_TABLET | Freq: Every day | ORAL | Status: DC

## 2014-04-03 ENCOUNTER — Emergency Department (HOSPITAL_COMMUNITY): Payer: Self-pay

## 2014-04-03 ENCOUNTER — Emergency Department (HOSPITAL_COMMUNITY)
Admission: EM | Admit: 2014-04-03 | Discharge: 2014-04-03 | Disposition: A | Discharge status: Home / Self Care | Payer: Self-pay | Attending: Emergency Medicine | Admitting: Emergency Medicine

## 2014-04-03 ENCOUNTER — Encounter (HOSPITAL_COMMUNITY): Payer: Self-pay | Admitting: Emergency Medicine

## 2014-04-03 DIAGNOSIS — M4120 Other idiopathic scoliosis, site unspecified: Secondary | ICD-10-CM | POA: Insufficient documentation

## 2014-04-03 DIAGNOSIS — M549 Dorsalgia, unspecified: Secondary | ICD-10-CM | POA: Insufficient documentation

## 2014-04-03 DIAGNOSIS — Z880 Allergy status to penicillin: Secondary | ICD-10-CM | POA: Insufficient documentation

## 2014-04-03 DIAGNOSIS — F17200 Nicotine dependence, unspecified, uncomplicated: Secondary | ICD-10-CM | POA: Insufficient documentation

## 2014-04-03 DIAGNOSIS — M419 Scoliosis, unspecified: Secondary | ICD-10-CM

## 2014-04-03 DIAGNOSIS — R6883 Chills (without fever): Secondary | ICD-10-CM | POA: Insufficient documentation

## 2014-04-03 MED ORDER — HYDROCODONE-ACETAMINOPHEN 5-325 MG PO TABS
2.0000 | ORAL_TABLET | Freq: Once | ORAL | Status: AC
  Administered 2014-04-03: 2 via ORAL
  Filled 2014-04-03: qty 2

## 2014-04-03 MED ORDER — CYCLOBENZAPRINE HCL 10 MG PO TABS: 10.0000 mg | ORAL_TABLET | Freq: Two times a day (BID) | ORAL | Status: DC | PRN

## 2014-04-03 MED ORDER — HYDROCODONE-ACETAMINOPHEN 7.5-325 MG/15ML PO SOLN: 10.0000 mL | Freq: Once | ORAL | Status: DC

## 2014-04-03 MED ORDER — HYDROCODONE-ACETAMINOPHEN 7.5-325 MG/15ML PO SOLN: 15.0000 mL | Freq: Three times a day (TID) | ORAL | Status: DC | PRN

## 2014-04-03 NOTE — ED Notes (Signed)
Patient with no complaints at this time. Respirations even and unlabored. Skin warm/dry. Discharge instructions reviewed with patient at this time. Patient given opportunity to voice concerns/ask questions. Patient discharged at this time and left Emergency Department with steady gait.   

## 2014-04-03 NOTE — ED Provider Notes (Signed)
CSN: [id number]     Arrival date & time 04/03/14  1836 History  This chart was scribed for non-physician practitioner, Emilia Beck, PA-C, working with Doug Sou, MD, by Modena Jansky, ED Scribe. This patient was seen in room WTR9/WTR9 and the patient's care was started at 7:15 PM.  Chief Complaint  Patient presents with  . Back Pain   The history is provided by the patient. No language interpreter was used.   HPI Comments: Jennifer Chaney is a 23 y.o. female with a hx of scoliosis who presents to the Emergency Department complaining of worsening constant moderate back pain that started 3 days ago. She reports that her current pain is more severe than her usual pain from scoliosis . She describes the pain as a sharp sensation with intermittent numbness. She states that the pain radiates down her legs and that she has some numbness in her hip. She states that she also has worsening constant moderate pain in her shoulders and neck also. She reports that she feels like her balance is off during ambulation. She states that she had chills today. She reports that she is currently on her period. She denies any hx of kidney stones, or dysuria.   Past Medical History  Diagnosis Date  . Preterm labor     pt-contractions with first pregnancy-del term  . Pregnancy induced hypertension     with second pregnancy  . Scoliosis 2014   Past Surgical History  Procedure Laterality Date  . No past surgeries     No family history on file. History  Substance Use Topics  . Smoking status: Current Some Day Smoker  . Smokeless tobacco: Never Used     Comment:  smokes hookah some days  . Alcohol Use: No     Comment: occasional before pregnancy   OB History   Grav Para Term Preterm Abortions TAB SAB Ect Mult Living   Review of Systems  Constitutional: Positive for chills.  Musculoskeletal: Positive for back pain and neck pain.  Neurological: Positive for numbness.  All  other systems reviewed and are negative.   Allergies  Penicillins  Home Medications   Prior to Admission medications   Not on File   BP 131/70  Pulse 67  Temp(Src) 98.2 F (36.8 C) (Oral)  Resp 18  SpO2 100%  LMP 04/03/2014 Physical Exam  Nursing note and vitals reviewed. Constitutional: She is oriented to person, place, and time. She appears well-developed and well-nourished. No distress.  HENT:  Head: Normocephalic and atraumatic.  Neck: Neck supple. No tracheal deviation present.  Cardiovascular: Normal rate.   Pulmonary/Chest: Effort normal. No respiratory distress.  Musculoskeletal: Normal range of motion. She exhibits tenderness.  Lower thoracic and lumbar spine TTP. Right thoracic and paraspinal TTP.   Neurological: She is alert and oriented to person, place, and time.  Lower extremities sensation and strength intact bilaterally. Pt able to ambulate. Pt reports back pain with ambulation.   Skin: Skin is warm and dry.  Psychiatric: She has a normal mood and affect. Her behavior is normal.    ED Course  Procedures (including critical care time) DIAGNOSTIC STUDIES: Oxygen Saturation is 100% on RA, normal by my interpretation.    COORDINATION OF CARE: 7:19 PM- Pt advised of plan for treatment which includes medication, and radiology and pt agrees.  Labs Review Labs Reviewed - No data to display  Imaging Review Dg Thoracic Spine  2 View  04/03/2014   CLINICAL DATA:  Generalized thoracic and lumbar spine pain.  EXAM: THORACIC SPINE - 2 VIEW  COMPARISON:  None.  FINDINGS: There is no evidence of thoracic spine fracture. Alignment is normal. Broad thoracolumbar levoscoliosis. Straightened thoracic kyphosis. No other significant bone abnormalities are identified.  IMPRESSION: Broad thoracolumbar levoscoliosis without fracture deformity or malalignment.   Electronically Signed   By: Awilda Metro   On: 04/03/2014 20:00   Dg Lumbar Spine Complete  04/03/2014    CLINICAL DATA:  Generalized thoracic and lumbar spine pain.  EXAM: LUMBAR SPINE - COMPLETE 4+ VIEW  COMPARISON:  None.  FINDINGS: Tiny T12 ribs. Five non rib-bearing lumbar-type vertebral bodies are intact with maintenance of the lumbar lordosis. Minimal grade 1 L5-S1 anterolisthesis may be projectional. Broad thoracolumbar levoscoliosis. Intervertebral disc heights are normal. No destructive bony lesions.  Sacroiliac joints are symmetric. Included prevertebral and paraspinal soft tissue planes are non-suspicious.  IMPRESSION: No acute fracture deformity. Broad thoracolumbar levoscoliosis on this nonweightbearing examination. Minimal grade 1 L5-S1 anterolisthesis could be projectional.   Electronically Signed   By: Awilda Metro   On: 04/03/2014 19:58     EKG Interpretation None      MDM   Final diagnoses:  Scoliosis of lumbar spine  Scoliosis of thoracic spine    8:11 PM Xrays show levoscoliosis of lumbar and thoracic spine. Patient will have vicodin and flexeril for pain. No bladder/bowel incontinence or saddle paresthesias. Patient will have recommended Orthopedic follow up.   I personally performed the services described in this documentation, which was scribed in my presence. The recorded information has been reviewed and is accurate.     Emilia Beck, PA-C 04/03/14 2013

## 2014-04-03 NOTE — Discharge Instructions (Signed)
Take Hycet as needed for pain. Take Flexeril as needed for muscle spasm. You may take these medications together. Follow up with Dr. Victorino Dike for further evaluation and management of your symptoms. Refer to attached documents for more information.

## 2014-04-03 NOTE — ED Notes (Signed)
Pt reports having scoliosis, which the patient reports increased pain that started three days ago in her shoulders, back, and neck. Pt is ambulatory to triage while holding her husbands arm. Pt is A/O x4, in NAD, and vitals are WDL.

## 2014-04-04 NOTE — ED Provider Notes (Signed)
Medical screening examination/treatment/procedure(s) were performed by non-physician practitioner and as supervising physician I was immediately available for consultation/collaboration.   EKG Interpretation None       Doug Sou, MD 04/04/14 0110

## 2014-05-11 ENCOUNTER — Encounter (HOSPITAL_COMMUNITY): Payer: Self-pay | Admitting: Emergency Medicine

## 2014-09-14 ENCOUNTER — Encounter (HOSPITAL_COMMUNITY): Payer: Self-pay | Admitting: Emergency Medicine

## 2014-09-14 ENCOUNTER — Emergency Department (HOSPITAL_COMMUNITY)
Admission: EM | Admit: 2014-09-14 | Discharge: 2014-09-15 | Discharge status: Against Medical Advice | Payer: Medicaid Other | Attending: Emergency Medicine | Admitting: Emergency Medicine

## 2014-09-14 DIAGNOSIS — R42 Dizziness and giddiness: Secondary | ICD-10-CM | POA: Diagnosis not present

## 2014-09-14 DIAGNOSIS — R51 Headache: Secondary | ICD-10-CM | POA: Diagnosis present

## 2014-09-14 NOTE — ED Notes (Signed)
Pt was called once for room assignment, no response.  

## 2014-09-14 NOTE — ED Notes (Signed)
Pt is c/o headache with dizziness and blurred vision that started 2 days ago  Pt states she has taken advil, last dose yesterday without relief  Pt is able to bend head down and touch chin to chest

## 2014-11-13 ENCOUNTER — Emergency Department (HOSPITAL_COMMUNITY): Payer: Medicaid Other

## 2014-11-13 ENCOUNTER — Encounter (HOSPITAL_COMMUNITY): Payer: Self-pay | Admitting: Emergency Medicine

## 2014-11-13 ENCOUNTER — Emergency Department (HOSPITAL_COMMUNITY)
Admission: EM | Admit: 2014-11-13 | Discharge: 2014-11-13 | Disposition: A | Discharge status: Home / Self Care | Payer: Medicaid Other | Attending: Emergency Medicine | Admitting: Emergency Medicine

## 2014-11-13 DIAGNOSIS — Z880 Allergy status to penicillin: Secondary | ICD-10-CM | POA: Diagnosis not present

## 2014-11-13 DIAGNOSIS — Z79899 Other long term (current) drug therapy: Secondary | ICD-10-CM | POA: Insufficient documentation

## 2014-11-13 DIAGNOSIS — R109 Unspecified abdominal pain: Secondary | ICD-10-CM | POA: Insufficient documentation

## 2014-11-13 DIAGNOSIS — R51 Headache: Secondary | ICD-10-CM | POA: Insufficient documentation

## 2014-11-13 DIAGNOSIS — Z87891 Personal history of nicotine dependence: Secondary | ICD-10-CM | POA: Diagnosis not present

## 2014-11-13 DIAGNOSIS — R0602 Shortness of breath: Secondary | ICD-10-CM

## 2014-11-13 DIAGNOSIS — Z8739 Personal history of other diseases of the musculoskeletal system and connective tissue: Secondary | ICD-10-CM | POA: Insufficient documentation

## 2014-11-13 DIAGNOSIS — R519 Headache, unspecified: Secondary | ICD-10-CM

## 2014-11-13 LAB — COMPREHENSIVE METABOLIC PANEL
ALBUMIN: 4.2 g/dL (ref 3.5–5.0)
ALT: 17 U/L (ref 14–54)
AST: 22 U/L (ref 15–41)
Alkaline Phosphatase: 47 U/L (ref 38–126)
Anion gap: 7 (ref 5–15)
BILIRUBIN TOTAL: 1.1 mg/dL (ref 0.3–1.2)
BUN: 13 mg/dL (ref 6–20)
CHLORIDE: 106 mmol/L (ref 101–111)
CO2: 25 mmol/L (ref 22–32)
Calcium: 8.9 mg/dL (ref 8.9–10.3)
Creatinine, Ser: 0.65 mg/dL (ref 0.44–1.00)
GFR calc Af Amer: >60 mL/min (ref >60)
GFR calc non Af Amer: >60 mL/min (ref >60)
Glucose, Bld: 86 mg/dL (ref 70–99)
Potassium: 4.1 mmol/L (ref 3.5–5.1)
Sodium: 138 mmol/L (ref 135–145)
Total Protein: 7.5 g/dL (ref 6.5–8.1)

## 2014-11-13 LAB — CBC WITH DIFFERENTIAL/PLATELET
Basophils Absolute: 0.1 10*3/uL (ref 0.0–0.1)
Basophils Relative: 1 % (ref 0–1)
Eosinophils Absolute: 0.1 10*3/uL (ref 0.0–0.7)
Eosinophils Relative: 1 % (ref 0–5)
HCT: 43.9 % (ref 36.0–46.0)
Hemoglobin: 14.8 g/dL (ref 12.0–15.0)
LYMPHS ABS: 2.2 10*3/uL (ref 0.7–4.0)
LYMPHS PCT: 33 % (ref 12–46)
MCH: 31.4 pg (ref 26.0–34.0)
MCHC: 33.7 g/dL (ref 30.0–36.0)
MCV: 93.2 fL (ref 78.0–100.0)
Monocytes Absolute: 0.4 10*3/uL (ref 0.1–1.0)
Monocytes Relative: 6 % (ref 3–12)
NEUTROS PCT: 59 % (ref 43–77)
Neutro Abs: 3.9 10*3/uL (ref 1.7–7.7)
PLATELETS: 256 10*3/uL (ref 150–400)
RBC: 4.71 MIL/uL (ref 3.87–5.11)
RDW: 12.5 % (ref 11.5–15.5)
WBC: 6.5 10*3/uL (ref 4.0–10.5)

## 2014-11-13 LAB — URINALYSIS, ROUTINE W REFLEX MICROSCOPIC
BILIRUBIN URINE: NEGATIVE
GLUCOSE, UA: NEGATIVE mg/dL
HGB URINE DIPSTICK: NEGATIVE
Ketones, ur: NEGATIVE mg/dL
Nitrite: NEGATIVE
Protein, ur: NEGATIVE mg/dL
SPECIFIC GRAVITY, URINE: 1.014 (ref 1.005–1.030)
Urobilinogen, UA: 1 mg/dL (ref 0.0–1.0)
pH: 7.5 (ref 5.0–8.0)

## 2014-11-13 LAB — URINE MICROSCOPIC-ADD ON

## 2014-11-13 MED ORDER — KETOROLAC TROMETHAMINE 30 MG/ML IJ SOLN
30.0000 mg | Freq: Once | INTRAMUSCULAR | Status: AC
  Administered 2014-11-13: 30 mg via INTRAVENOUS
  Filled 2014-11-13: qty 1

## 2014-11-13 MED ORDER — LORAZEPAM 2 MG/ML IJ SOLN
0.5000 mg | Freq: Once | INTRAMUSCULAR | Status: AC
Start: 2014-11-13 — End: 2014-11-13
  Administered 2014-11-13: 0.5 mg via INTRAVENOUS
  Filled 2014-11-13: qty 1

## 2014-11-13 MED ORDER — TRAMADOL HCL 50 MG PO TABS: 50.0000 mg | ORAL_TABLET | Freq: Four times a day (QID) | ORAL | Status: DC | PRN

## 2014-11-13 MED ORDER — SODIUM CHLORIDE 0.9 % IV BOLUS (SEPSIS)
1000.0000 mL | Freq: Once | INTRAVENOUS | Status: AC
  Administered 2014-11-13: 1000 mL via INTRAVENOUS

## 2014-11-13 NOTE — ED Notes (Signed)
Questions, concerns denied r/t DC. Pt ambulatory and a&ox4

## 2014-11-13 NOTE — ED Notes (Signed)
Patient transported to X-ray 

## 2014-11-13 NOTE — ED Provider Notes (Signed)
CSN: [id number]     Arrival date & time 11/13/14  1432 History   First MD Initiated Contact with Patient 11/13/14 1626     Chief Complaint  Patient presents with  . Headache  . Abdominal Pain  . Fatigue     (Consider location/radiation/quality/duration/timing/severity/associated sxs/prior Treatment) Patient is a 24 y.o. female presenting with headaches and abdominal pain. The history is provided by the patient (pt complains of a headache and weakness).  Headache Pain location:  Generalized Quality:  Dull Radiates to:  Does not radiate Severity currently:  5/10 Severity at highest:  8/10 Onset quality:  Gradual Timing:  Constant Progression:  Unchanged Chronicity:  New Associated symptoms: no back pain, no congestion, no cough, no diarrhea, no fatigue, no seizures and no sinus pressure   Abdominal Pain Associated symptoms: no chest pain, no cough, no diarrhea, no fatigue and no hematuria     Past Medical History  Diagnosis Date  . Preterm labor     pt-contractions with first pregnancy-del term  . Pregnancy induced hypertension     with second pregnancy  . Scoliosis 2014   Past Surgical History  Procedure Laterality Date  . No past surgeries     History reviewed. No pertinent family history. History  Substance Use Topics  . Smoking status: Former Games developermoker  . Smokeless tobacco: Never Used     Comment:  smokes hookah some days  . Alcohol Use: Yes     Comment: occasional    OB History    Gravida Para Term Preterm AB TAB SAB Ectopic Multiple Living   4 3 3  1  1   3      Review of Systems  Constitutional: Negative for appetite change and fatigue.  HENT: Negative for congestion, ear discharge and sinus pressure.   Eyes: Negative for discharge.  Respiratory: Negative for cough.   Cardiovascular: Negative for chest pain.  Gastrointestinal: Negative for diarrhea.  Genitourinary: Negative for frequency and hematuria.  Musculoskeletal: Negative for back pain.  Skin:  Negative for rash.  Neurological: Positive for headaches. Negative for seizures.  Psychiatric/Behavioral: Negative for hallucinations.      Allergies  Penicillins  Home Medications   Prior to Admission medications   Medication Sig Start Date End Date Taking? Authorizing Provider  ibuprofen (ADVIL,MOTRIN) 200 MG tablet Take 200-400 mg by mouth every 6 (six) hours as needed for headache, mild pain or moderate pain.   Yes Historical Provider, MD  cyclobenzaprine (FLEXERIL) 10 MG tablet Take 1 tablet (10 mg total) by mouth 2 (two) times daily as needed for muscle spasms. Patient not taking: Reported on 11/13/2014 04/03/14   Emilia BeckKaitlyn Szekalski, PA-C  HYDROcodone-acetaminophen (HYCET) 7.5-325 mg/15 ml solution Take 15 mLs by mouth every 8 (eight) hours as needed for moderate pain. Patient not taking: Reported on 11/13/2014 04/03/14   Emilia BeckKaitlyn Szekalski, PA-C  traMADol (ULTRAM) 50 MG tablet Take 1 tablet (50 mg total) by mouth every 6 (six) hours as needed. 11/13/14   Bethann BerkshireJoseph Paytyn Mesta, MD   BP 102/64 mmHg  Pulse 66  Temp(Src) 98.4 F (36.9 C) (Oral)  Resp 16  SpO2 100%  LMP 11/04/2014 Physical Exam  Constitutional: She is oriented to person, place, and time. She appears well-developed.  HENT:  Head: Normocephalic.  Eyes: Conjunctivae and EOM are normal. No scleral icterus.  Neck: Neck supple. No thyromegaly present.  Cardiovascular: Normal rate and regular rhythm.  Exam reveals no gallop and no friction rub.   No murmur heard. Pulmonary/Chest: No stridor.  She has no wheezes. She has no rales. She exhibits no tenderness.  Abdominal: She exhibits no distension. There is no tenderness. There is no rebound.  Musculoskeletal: Normal range of motion. She exhibits no edema.  Lymphadenopathy:    She has no cervical adenopathy.  Neurological: She is oriented to person, place, and time. She exhibits normal muscle tone. Coordination normal.  Skin: No rash noted. No erythema.  Psychiatric: She has a  normal mood and affect. Her behavior is normal.    ED Course  Procedures (including critical care time) Labs Review Labs Reviewed  URINALYSIS, ROUTINE W REFLEX MICROSCOPIC - Abnormal; Notable for the following:    Leukocytes, UA TRACE (*)    All other components within normal limits  COMPREHENSIVE METABOLIC PANEL  CBC WITH DIFFERENTIAL/PLATELET  URINE MICROSCOPIC-ADD ON    Imaging Review Dg Chest 2 View  11/13/2014   CLINICAL DATA:  Patient states she has been experiencing dizziness, SOB and chest pain X 1 week. Patient denies any cough. Patient has no hx of any heart or lung conditions and no surgeries on the heart or lungs. Patient is a smoker. Patient was shielded for the exam. Hx of scoliosis.  EXAM: CHEST  2 VIEW  COMPARISON:  None.  FINDINGS: The heart size and mediastinal contours are within normal limits. Both lungs are clear. No pleural effusion or pneumothorax. S-shaped scoliosis of the thoracolumbar spine. No other bony abnormality.  IMPRESSION: No active cardiopulmonary disease.   Electronically Signed   By: Amie Portlandavid  Ormond M.D.   On: 11/13/2014 18:12   Ct Head Wo Contrast  11/13/2014   CLINICAL DATA:  Severe headache and weakness for approximately 1 week.  EXAM: CT HEAD WITHOUT CONTRAST  TECHNIQUE: Contiguous axial images were obtained from the base of the skull through the vertex without intravenous contrast.  COMPARISON:  04/15/2011  FINDINGS: No evidence of intracranial hemorrhage, brain edema, or other signs of acute infarction. No evidence of intracranial mass lesion or mass effect.  No abnormal extraaxial fluid collections identified. Ventricles are normal in size. No skull abnormality identified.  IMPRESSION: Negative noncontrast head CT.   Electronically Signed   By: Myles RosenthalJohn  Stahl M.D.   On: 11/13/2014 16:47     EKG Interpretation None      MDM   Final diagnoses:  SOB (shortness of breath)  Headache around the eyes    Headache,  Nl studies.  tx with ultram and follow  up    Bethann BerkshireJoseph Adriel Desrosier, MD 11/13/14 1843

## 2014-11-13 NOTE — ED Notes (Signed)
EKG given to Dr. Zammit 

## 2014-11-13 NOTE — ED Notes (Signed)
DC BP results discussed with provider. M.D. ok with pt for DC

## 2014-11-13 NOTE — Discharge Instructions (Signed)
Follow up with a family md next week if not improving.  Rest this weekend

## 2014-11-13 NOTE — ED Notes (Signed)
Pt reports HA and weakness for the past week. Today weakness got the point where she was unable to stand up for a while. Also having LLQ abd pain, no n/v/d. LMP yesterday. Pt reports she has lost 16lb in last month.

## 2015-03-29 ENCOUNTER — Ambulatory Visit: Payer: Medicaid Other | Admitting: Obstetrics & Gynecology

## 2015-04-02 ENCOUNTER — Ambulatory Visit: Payer: Medicaid Other | Admitting: Student

## 2015-12-24 ENCOUNTER — Encounter (HOSPITAL_COMMUNITY): Payer: Self-pay | Admitting: Emergency Medicine

## 2015-12-24 ENCOUNTER — Emergency Department (HOSPITAL_COMMUNITY)
Admission: EM | Admit: 2015-12-24 | Discharge: 2015-12-25 | Disposition: A | Discharge status: Home / Self Care | Payer: Medicaid Other | Attending: Emergency Medicine | Admitting: Emergency Medicine

## 2015-12-24 DIAGNOSIS — R109 Unspecified abdominal pain: Secondary | ICD-10-CM | POA: Insufficient documentation

## 2015-12-24 DIAGNOSIS — R110 Nausea: Secondary | ICD-10-CM | POA: Insufficient documentation

## 2015-12-24 DIAGNOSIS — Z5321 Procedure and treatment not carried out due to patient leaving prior to being seen by health care provider: Secondary | ICD-10-CM | POA: Insufficient documentation

## 2015-12-24 LAB — COMPREHENSIVE METABOLIC PANEL
ALK PHOS: 46 U/L (ref 38–126)
ALT: 15 U/L (ref 14–54)
AST: 19 U/L (ref 15–41)
Albumin: 3.9 g/dL (ref 3.5–5.0)
Anion gap: 6 (ref 5–15)
BILIRUBIN TOTAL: 0.8 mg/dL (ref 0.3–1.2)
BUN: 14 mg/dL (ref 6–20)
CALCIUM: 8.8 mg/dL — AB (ref 8.9–10.3)
CO2: 26 mmol/L (ref 22–32)
Chloride: 104 mmol/L (ref 101–111)
Creatinine, Ser: 0.54 mg/dL (ref 0.44–1.00)
GFR calc Af Amer: >60 mL/min (ref >60)
GFR calc non Af Amer: >60 mL/min (ref >60)
Glucose, Bld: 75 mg/dL (ref 65–99)
POTASSIUM: 3.8 mmol/L (ref 3.5–5.1)
Sodium: 136 mmol/L (ref 135–145)
TOTAL PROTEIN: 7.2 g/dL (ref 6.5–8.1)

## 2015-12-24 LAB — LIPASE, BLOOD: Lipase: 48 U/L (ref 11–51)

## 2015-12-24 LAB — CBC
HEMATOCRIT: 40.9 % (ref 36.0–46.0)
Hemoglobin: 13.8 g/dL (ref 12.0–15.0)
MCH: 31.4 pg (ref 26.0–34.0)
MCHC: 33.7 g/dL (ref 30.0–36.0)
MCV: 93.2 fL (ref 78.0–100.0)
PLATELETS: 258 10*3/uL (ref 150–400)
RBC: 4.39 MIL/uL (ref 3.87–5.11)
RDW: 12.7 % (ref 11.5–15.5)
WBC: 7.5 10*3/uL (ref 4.0–10.5)

## 2015-12-24 NOTE — ED Notes (Signed)
Pt from home with complaints of nausea and abdominal pain that she describes as a "muscular tightness". Pt states she is also having c/o constipation with her LBM about 3 days ago. Pt states her pain is 8/10. Pt states it is difficult to take a deep breath due to the bloating she feels in her abdomen.

## 2015-12-25 ENCOUNTER — Encounter (HOSPITAL_COMMUNITY): Payer: Self-pay | Admitting: *Deleted

## 2015-12-25 ENCOUNTER — Emergency Department (HOSPITAL_COMMUNITY): Payer: Self-pay

## 2015-12-25 ENCOUNTER — Emergency Department (HOSPITAL_COMMUNITY)
Admission: EM | Admit: 2015-12-25 | Discharge: 2015-12-25 | Disposition: A | Discharge status: Home / Self Care | Payer: Self-pay | Attending: Emergency Medicine | Admitting: Emergency Medicine

## 2015-12-25 DIAGNOSIS — Z87891 Personal history of nicotine dependence: Secondary | ICD-10-CM | POA: Insufficient documentation

## 2015-12-25 DIAGNOSIS — K5900 Constipation, unspecified: Secondary | ICD-10-CM | POA: Insufficient documentation

## 2015-12-25 DIAGNOSIS — Z79899 Other long term (current) drug therapy: Secondary | ICD-10-CM | POA: Insufficient documentation

## 2015-12-25 LAB — POC URINE PREG, ED: Preg Test, Ur: NEGATIVE

## 2015-12-25 MED ORDER — POLYETHYLENE GLYCOL 3350 17 G PO PACK: 17.0000 g | PACK | Freq: Every day | ORAL | Status: DC

## 2015-12-25 MED ORDER — DOCUSATE SODIUM 283 MG RE ENEM: 1.0000 | ENEMA | Freq: Every day | RECTAL | Status: DC | PRN

## 2015-12-25 NOTE — Discharge Instructions (Signed)

## 2015-12-25 NOTE — ED Notes (Addendum)
Pt states she is having difficulty breathing, upper abdominal pain, constipation for the past 5 days. Pt states she has also gained 7lbs in the past week. Pt was here for same yesterday, had lab work performed but left before being seen.

## 2015-12-25 NOTE — ED Provider Notes (Signed)
CSN: [id number]     Arrival date & time 12/25/15  1419 History   First MD Initiated Contact with Patient 12/25/15 1545     Chief Complaint  Patient presents with  . Abdominal Pain  . Constipation    HPI   Jennifer Chaney is an 25 y.o. female who presents to the ED for evaluation of constipation and abdominal pain. She states she has not had a BM in over three days. She states her abdomen hurts all over and she feels very bloated. She feels like the pressure from the bloating is making it difficult to breathe at times but she does not feel short of breath. Denies chest pain. She states she is passing gas. Denies nausea, vomiting, diarrhea. She states she has gained seven pounds in the past week. She has tried some OTC laxatives with no relief. Denies fever, chills, urinary symptoms. She was seen originally in triage yesterday with some bloodwork drawn but ultimately left prior to being seen by a provider.   Past Medical History  Diagnosis Date  . Preterm labor     pt-contractions with first pregnancy-del term  . Pregnancy induced hypertension     with second pregnancy  . Scoliosis 2014   Past Surgical History  Procedure Laterality Date  . No past surgeries     No family history on file. Social History  Substance Use Topics  . Smoking status: Former Games developer  . Smokeless tobacco: Never Used     Comment:  smokes hookah some days  . Alcohol Use: Yes     Comment: occasional    OB History    Gravida Para Term Preterm AB TAB SAB Ectopic Multiple Living   Review of Systems  All other systems reviewed and are negative.     Allergies  Penicillins  Home Medications   Prior to Admission medications   Medication Sig Start Date End Date Taking? Authorizing Provider  Homeopathic Products (BHI CONSTIPATION RELIEF) TABS Take 1 tablet by mouth daily as needed (constipation).   Yes Historical Provider, MD  cyclobenzaprine (FLEXERIL) 10 MG tablet Take 1 tablet (10  mg total) by mouth 2 (two) times daily as needed for muscle spasms. Patient not taking: Reported on 11/13/2014 04/03/14   Emilia Beck, PA-C  HYDROcodone-acetaminophen (HYCET) 7.5-325 mg/15 ml solution Take 15 mLs by mouth every 8 (eight) hours as needed for moderate pain. Patient not taking: Reported on 11/13/2014 04/03/14   Emilia Beck, PA-C  ibuprofen (ADVIL,MOTRIN) 200 MG tablet Take 200-400 mg by mouth every 6 (six) hours as needed for headache, mild pain or moderate pain.    Historical Provider, MD  traMADol (ULTRAM) 50 MG tablet Take 1 tablet (50 mg total) by mouth every 6 (six) hours as needed. Patient not taking: Reported on 12/25/2015 11/13/14   Bethann Berkshire, MD   BP 116/61 mmHg  Pulse 85  Temp(Src) 98.5 F (36.9 C) (Oral)  Resp 18  SpO2 100%  LMP 12/05/2015 Physical Exam  Constitutional: She is oriented to person, place, and time.  HENT:  Right Ear: External ear normal.  Left Ear: External ear normal.  Nose: Nose normal.  Mouth/Throat: Oropharynx is clear and moist. No oropharyngeal exudate.  Eyes: Conjunctivae and EOM are normal. Pupils are equal, round, and reactive to light.  Neck: Normal range of motion. Neck supple.  Cardiovascular: Normal rate, regular rhythm, normal heart sounds and intact distal pulses.   Pulmonary/Chest:  Effort normal and breath sounds normal. No respiratory distress. She has no wheezes. She exhibits no tenderness.  Abdominal: Soft. Bowel sounds are normal. She exhibits no distension. There is no rebound and no guarding.  Abdomen soft. Mild diffuse tenderness to palpation without guarding. No distension. No CVA tenderness  Genitourinary:  Pt declined  Musculoskeletal: She exhibits no edema.  Neurological: She is alert and oriented to person, place, and time. No cranial nerve deficit.  Skin: Skin is warm and dry.  Psychiatric: She has a normal mood and affect.  Nursing note and vitals reviewed.   ED Course  Procedures (including critical  care time) Labs Review Labs Reviewed  URINALYSIS, ROUTINE W REFLEX MICROSCOPIC (NOT AT [facility])  POC URINE PREG, ED    Imaging Review Dg Abd Acute W/chest  12/25/2015  CLINICAL DATA:  Constipation and shortness of breath for 5 days. EXAM: DG ABDOMEN ACUTE W/ 1V CHEST COMPARISON:  11/13/2014 FINDINGS: There is no evidence of dilated bowel loops or free intraperitoneal air. Large amount of formed stool matter throughout the colon. No radiopaque calculi or other significant radiographic abnormality is seen. Heart size and mediastinal contours are within normal limits. Both lungs are clear. IMPRESSION: Nonobstructive bowel gas pattern. Constipation. No acute cardiopulmonary disease. Electronically Signed   By: Ted Mcalpineobrinka  Dimitrova M.D.   On: 12/25/2015 17:36   I have personally reviewed and evaluated these images and lab results as part of my medical decision-making.   EKG Interpretation None      MDM   Final diagnoses:  Constipation, unspecified constipation type    Acute abdominal x-ray reveals evidence of constipation without evidence of obstruction. She has no nausea or vomiting. She is nontoxic appearing with a nonperitoneal abdominal exam. Will trial miralax at home. She is also requesting an rx for an enema "just in case." will give rx for docusate enema. Instructed close f/u with PCP. ER return precautions given.    Carlene CoriaSerena Y Benjimin Hadden, PA-C 12/25/15 1918  Doug SouSam Jacubowitz, MD 12/26/15 [phone]

## 2017-02-20 ENCOUNTER — Encounter (INDEPENDENT_AMBULATORY_CARE_PROVIDER_SITE_OTHER): Payer: Self-pay | Admitting: Orthopaedic Surgery

## 2017-02-20 ENCOUNTER — Ambulatory Visit (INDEPENDENT_AMBULATORY_CARE_PROVIDER_SITE_OTHER): Payer: Medicaid Other | Admitting: Orthopaedic Surgery

## 2017-02-20 VITALS — BP 106/69 | HR 68 | Ht 67.0 in | Wt 150.0 lb

## 2017-02-20 DIAGNOSIS — M25551 Pain in right hip: Secondary | ICD-10-CM | POA: Diagnosis not present

## 2017-02-20 DIAGNOSIS — M419 Scoliosis, unspecified: Secondary | ICD-10-CM

## 2017-02-21 NOTE — Progress Notes (Signed)
Office Visit Note   Patient: Jennifer Chaney           Date of Birth: 1990/12/02           MRN: [id number] Visit Date: 02/20/2017              Requested by: No referring provider defined for this encounter. PCP: Jackie Plum, MD   Assessment & Plan: Visit Diagnoses:  1. Scoliosis of thoracolumbar spine, unspecified scoliosis type   2. Pain in right hip     Plan: We had discussion about treatment for scoliosis. She may have some nerve root compression causing her pain does not recommend proceeding with an MRI scan lumbar for evaluation. I discussed with her that she do not think she is at risk for being paralyzed immediately unable to walk and that progression of her scoliosis at her age is extremely slow rate approximately 1 per year. We discussed regular exercise program for stretching regimen. Walking to help with the bone density. She can return after MRI.  Follow-Up Instructions: No Follow-up on file.   Orders:  Orders Placed This Encounter  Procedures  . MR Lumbar Spine w/o contrast   No orders of the defined types were placed in this encounter.     Procedures: No procedures performed   Clinical Data: No additional findings.   Subjective: Chief Complaint  Patient presents with  . Spine - Pain    HPI 26 year old female here for evaluation of scoliosis. She states she's had back pain for years. She had x-rays done at [facility] which showed scoliosis and she does not have a disc and those films are not available. Previous flat plate of her abdomen last year showed lumbar scoliosis with compensatory thoracic curve. She states her legs sometimes feel heavy and she has pain in her back that radiates bilaterally to her right hip down to the trochanter. Does not radiate all the way down to below the knees. She denies associated bowel or bladder symptoms. She has a family member who had scoliosis when she got older she became unable to walk. She states  this greatly concerns her. She has not been to physical therapy. Patient states she was given tramadol the past for pain and also uses ibuprofen. She states her pain is moderate and at times is severe and it's been worse the last 3 weeks.  Review of Systems is systems positive for breast implants December 2017. 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 She does not smoke and occasionally drinks. Negative for hypertension and cardiac problems respiratory problems GI problems. Positive for chronic low back pain.   Objective: Vital Signs: BP 106/69   Pulse 68   Ht 5\' 7"  (1.702 m)   Wt 150 lb (68 kg)   BMI 23.49 kg/m   Physical Exam  Constitutional: She is oriented to person, place, and time. She appears well-developed.  HENT:  Head: Normocephalic.  Right Ear: External ear normal.  Left Ear: External ear normal.  Eyes: Pupils are equal, round, and reactive to light.  Neck: No tracheal deviation present. No thyromegaly present.  Cardiovascular: Normal rate.   Pulmonary/Chest: Effort normal.  Abdominal: Soft.  Musculoskeletal:  Patient has mild scapular asymmetry. Right side of the pelvis is 1 cm higher than the left. She has tenderness over the sciatic notch on the left laterally over the pelvis and right trochanter region. Normal hip range of motion negative Faber test. Knee and ankle jerk are 2+ and symmetrical she  is able to heel and toe walk. Good for flexion and extension with fingertips to floor. 2 cm left lumbar hump with Forward flexion. Anterior tib EHL is strong no quad weakness no abductor weakness no atrophy of the hamstrings thighs or calf muscles.  Neurological: She is alert and oriented to person, place, and time.  Skin: Skin is warm and dry.  Psychiatric: She has a normal mood and affect. Her behavior is normal.    Ortho Exam  Specialty Comments:  No specialty comments available.  Imaging: Previous x-rays last year showed a 30 lumbar curve apex at L2. She has  compensatory thoracic curve. With the last thoracic rotation.   PMFS History: Patient Active Problem List   Diagnosis Date Noted  . Contraception management 02/06/2014  . Bone and joint disorders of maternal back, pelvis, and lower limbs, antepartum(648.73) 06/24/2013  . Scoliosis 06/12/2013   Past Medical History:  Diagnosis Date  . Pregnancy induced hypertension    with second pregnancy  . Preterm labor    pt-contractions with first pregnancy-del term  . Scoliosis 2014    No family history on file.  Past Surgical History:  Procedure Laterality Date  . NO PAST SURGERIES     Social History   Occupational History  . Not on file.   Social History Main Topics  . Smoking status: Former Games developermoker  . Smokeless tobacco: Never Used     Comment:  smokes hookah some days  . Alcohol use Yes     Comment: occasional   . Drug use: No  . Sexual activity: Yes    Birth control/ protection: None

## 2017-03-03 ENCOUNTER — Ambulatory Visit
Admission: RE | Admit: 2017-03-03 | Discharge: 2017-03-03 | Disposition: A | Discharge status: Home / Self Care | Payer: Medicaid Other | Source: Ambulatory Visit | Attending: Orthopaedic Surgery | Admitting: Orthopaedic Surgery

## 2017-03-03 DIAGNOSIS — M25551 Pain in right hip: Secondary | ICD-10-CM

## 2017-03-03 DIAGNOSIS — M419 Scoliosis, unspecified: Secondary | ICD-10-CM

## 2017-03-27 ENCOUNTER — Ambulatory Visit (INDEPENDENT_AMBULATORY_CARE_PROVIDER_SITE_OTHER): Payer: Medicaid Other | Admitting: Orthopaedic Surgery

## 2017-04-03 ENCOUNTER — Ambulatory Visit (INDEPENDENT_AMBULATORY_CARE_PROVIDER_SITE_OTHER): Payer: Medicaid Other | Admitting: Orthopaedic Surgery

## 2017-04-03 ENCOUNTER — Encounter (INDEPENDENT_AMBULATORY_CARE_PROVIDER_SITE_OTHER): Payer: Self-pay | Admitting: Orthopaedic Surgery

## 2017-04-03 VITALS — BP 104/67 | HR 80 | Ht 67.0 in | Wt 150.0 lb

## 2017-04-03 DIAGNOSIS — M4126 Other idiopathic scoliosis, lumbar region: Secondary | ICD-10-CM | POA: Diagnosis not present

## 2017-04-03 NOTE — Progress Notes (Signed)
Office Visit Note   Patient: Jennifer Chaney           Date of Birth: 1991/01/15           MRN: [id number] Visit Date: 04/03/2017              Requested by: Jackie Plum, MD [address] [place], [place] [address] PCP: Jackie Plum, MD   Assessment & Plan: Visit Diagnoses:  1. Other idiopathic scoliosis, lumbar region     Plan: Patient is a 30 curve. MRI scan shows no spinal canal or neural foraminal stenosis. We discussed an exercise program with core strengthening and stretching. Reviewed  the MRI scan with the patient and  she has no areas of nerve compression. Discussion about correction of the curve with problems with adjacent level degeneration at a  later time. Patient expressed disappointment that surgery is not recommended. Long discussion with patient about problems associated with surgery and that with a mild curve that she has her risks for significant curve progression is extremely low. We discussed fusion causes that segment to be solid and nonmobile. She certainly can seek another opinion which we discussed.  Follow-Up Instructions: Return if symptoms worsen or fail to improve.   Orders:  No orders of the defined types were placed in this encounter.  No orders of the defined types were placed in this encounter.     Procedures: No procedures performed   Clinical Data: No additional findings.   Subjective: Chief Complaint  Patient presents with  . Lower Back - Follow-up    HPI 26 year old female returns for ongoing chronic back pain. She states at times she has to lay down her back is severe she uses ibuprofen. Patient states that she is ready for surgery and has had an MRI scan for review. Negative for bowel bladder symptoms. She states her back aches with activity. Patient has a 30 lumbar curve apex at L2 with long compensatory thoracic curve. Review of Systems 14 point review of systems is updated. Unchanged from 02/20/2017  office visit. She's had previous breast implants December 2017.   Objective: Vital Signs: BP 104/67   Pulse 80   Ht  (1.702 m)   Wt 150 lb (68 kg)   BMI 23.49 kg/m   Physical Exam  Constitutional: She is oriented to person, place, and time. She appears well-developed.  HENT:  Head: Normocephalic.  Right Ear: External ear normal.  Left Ear: External ear normal.  Eyes: Pupils are equal, round, and reactive to light.  Neck: No tracheal deviation present. No thyromegaly present.  Cardiovascular: Normal rate.   Pulmonary/Chest: Effort normal. She has no wheezes.  Abdominal: Soft.  Neurological: She is alert and oriented to person, place, and time.  Skin: Skin is warm and dry.  Psychiatric: She has a normal mood and affect. Her behavior is normal.    Ortho Exam patient has mild pelvic obliquity right side 1 cm higher than left. Normal hip range of motion negative straight leg raising reflexes are 2+ and symmetrical. No isolated motor weakness lower extremities normal pulses.  Specialty Comments:  No specialty comments available.  Imaging: Study Result   CLINICAL DATA:  Scoliosis.  Back pain radiating to the buttocks.  EXAM: MRI LUMBAR SPINE WITHOUT CONTRAST  TECHNIQUE: Multiplanar, multisequence MR imaging of the lumbar spine was performed. No intravenous contrast was administered.  COMPARISON:  Abdominal radiograph 12/25/2015  FINDINGS: Segmentation:  Normal  Alignment:  There is lumbar levoscoliosis  with apex at L2.  Vertebrae: Superior endplate Schmorl's node at T12. No compression fracture or other acute abnormality.  Conus medullaris: Extends to the L1 level and appears normal.  Paraspinal and other soft tissues: Negative.  Disc levels:  There is no disc herniation. No spinal canal or neural foraminal stenosis.  Visualized sacrum: Normal.  IMPRESSION: Marked lumbar levoscoliosis with apex at L2. No spinal canal or neural foraminal  stenosis.   Electronically Signed   By: Deatra Robinson M.D.   On: 03/04/2017 05:28       PMFS History: Patient Active Problem List   Diagnosis Date Noted  . Contraception management 02/06/2014  . Bone and joint disorders of maternal back, pelvis, and lower limbs, antepartum(648.73) 06/24/2013  . Scoliosis 06/12/2013   Past Medical History:  Diagnosis Date  . Pregnancy induced hypertension    with second pregnancy  . Preterm labor    pt-contractions with first pregnancy-del term  . Scoliosis 2014    No family history on file.  Past Surgical History:  Procedure Laterality Date  . NO PAST SURGERIES     Social History   Occupational History  . Not on file.   Social History Main Topics  . Smoking status: Former Games developer  . Smokeless tobacco: Never Used     Comment:  smokes hookah some days  . Alcohol use Yes     Comment: occasional   . Drug use: No  . Sexual activity: Yes    Birth control/ protection: None

## 2017-09-07 ENCOUNTER — Emergency Department (HOSPITAL_COMMUNITY): Payer: Medicaid Other

## 2017-09-07 ENCOUNTER — Inpatient Hospital Stay (HOSPITAL_COMMUNITY)
Admission: EM | Admit: 2017-09-07 | Discharge: 2017-09-13 | DRG: 460 | Disposition: A | Discharge status: Home / Self Care | Payer: Medicaid Other | Attending: Neurosurgery | Admitting: Neurosurgery

## 2017-09-07 ENCOUNTER — Encounter (HOSPITAL_COMMUNITY): Payer: Self-pay | Admitting: Emergency Medicine

## 2017-09-07 ENCOUNTER — Other Ambulatory Visit: Payer: Self-pay

## 2017-09-07 DIAGNOSIS — M549 Dorsalgia, unspecified: Secondary | ICD-10-CM | POA: Diagnosis present

## 2017-09-07 DIAGNOSIS — Z720 Tobacco use: Secondary | ICD-10-CM | POA: Diagnosis not present

## 2017-09-07 DIAGNOSIS — Z91410 Personal history of adult physical and sexual abuse: Secondary | ICD-10-CM | POA: Diagnosis not present

## 2017-09-07 DIAGNOSIS — M419 Scoliosis, unspecified: Secondary | ICD-10-CM | POA: Diagnosis present

## 2017-09-07 DIAGNOSIS — W134XXA Fall from, out of or through window, initial encounter: Secondary | ICD-10-CM | POA: Diagnosis present

## 2017-09-07 DIAGNOSIS — Z880 Allergy status to penicillin: Secondary | ICD-10-CM

## 2017-09-07 DIAGNOSIS — S32021A Stable burst fracture of second lumbar vertebra, initial encounter for closed fracture: Principal | ICD-10-CM | POA: Diagnosis present

## 2017-09-07 DIAGNOSIS — M5416 Radiculopathy, lumbar region: Secondary | ICD-10-CM | POA: Diagnosis present

## 2017-09-07 DIAGNOSIS — Z87891 Personal history of nicotine dependence: Secondary | ICD-10-CM | POA: Diagnosis not present

## 2017-09-07 DIAGNOSIS — Y9339 Activity, other involving climbing, rappelling and jumping off: Secondary | ICD-10-CM

## 2017-09-07 DIAGNOSIS — Z008 Encounter for other general examination: Secondary | ICD-10-CM | POA: Diagnosis not present

## 2017-09-07 DIAGNOSIS — W139XXA Fall from, out of or through building, not otherwise specified, initial encounter: Secondary | ICD-10-CM

## 2017-09-07 DIAGNOSIS — S32009A Unspecified fracture of unspecified lumbar vertebra, initial encounter for closed fracture: Secondary | ICD-10-CM | POA: Diagnosis present

## 2017-09-07 DIAGNOSIS — Z419 Encounter for procedure for purposes other than remedying health state, unspecified: Secondary | ICD-10-CM

## 2017-09-07 DIAGNOSIS — Y92009 Unspecified place in unspecified non-institutional (private) residence as the place of occurrence of the external cause: Secondary | ICD-10-CM

## 2017-09-07 DIAGNOSIS — T1490XA Injury, unspecified, initial encounter: Secondary | ICD-10-CM

## 2017-09-07 DIAGNOSIS — S32001A Stable burst fracture of unspecified lumbar vertebra, initial encounter for closed fracture: Secondary | ICD-10-CM | POA: Diagnosis present

## 2017-09-07 DIAGNOSIS — Z630 Problems in relationship with spouse or partner: Secondary | ICD-10-CM | POA: Diagnosis not present

## 2017-09-07 DIAGNOSIS — G4700 Insomnia, unspecified: Secondary | ICD-10-CM | POA: Diagnosis not present

## 2017-09-07 LAB — CBC WITH DIFFERENTIAL/PLATELET
Basophils Absolute: 0 10*3/uL (ref 0.0–0.1)
Basophils Relative: 0 %
Eosinophils Absolute: 0 10*3/uL (ref 0.0–0.7)
Eosinophils Relative: 0 %
HCT: 46.4 % — ABNORMAL HIGH (ref 36.0–46.0)
Hemoglobin: 16.3 g/dL — ABNORMAL HIGH (ref 12.0–15.0)
Lymphocytes Relative: 10 %
Lymphs Abs: 1.4 10*3/uL (ref 0.7–4.0)
MCH: 33.8 pg (ref 26.0–34.0)
MCHC: 35.1 g/dL (ref 30.0–36.0)
MCV: 96.3 fL (ref 78.0–100.0)
Monocytes Absolute: 0.6 10*3/uL (ref 0.1–1.0)
Monocytes Relative: 4 %
Neutro Abs: 12 10*3/uL — ABNORMAL HIGH (ref 1.7–7.7)
Neutrophils Relative %: 86 %
Platelets: 287 10*3/uL (ref 150–400)
RBC: 4.82 MIL/uL (ref 3.87–5.11)
RDW: 12.1 % (ref 11.5–15.5)
WBC: 14 10*3/uL — ABNORMAL HIGH (ref 4.0–10.5)

## 2017-09-07 LAB — BASIC METABOLIC PANEL
Anion gap: 8 (ref 5–15)
BUN: 13 mg/dL (ref 6–20)
CALCIUM: 8.5 mg/dL — AB (ref 8.9–10.3)
CO2: 25 mmol/L (ref 22–32)
CREATININE: 0.55 mg/dL (ref 0.44–1.00)
Chloride: 103 mmol/L (ref 101–111)
GFR calc Af Amer: >60 mL/min (ref >60)
GLUCOSE: 99 mg/dL (ref 65–99)
Potassium: 4 mmol/L (ref 3.5–5.1)
SODIUM: 136 mmol/L (ref 135–145)

## 2017-09-07 LAB — TYPE AND SCREEN
ABO/RH(D): B POS
Antibody Screen: NEGATIVE

## 2017-09-07 LAB — I-STAT BETA HCG BLOOD, ED (MC, WL, AP ONLY): I-stat hCG, quantitative: <5 m[IU]/mL (ref <5)

## 2017-09-07 LAB — ABO/RH: ABO/RH(D): B POS

## 2017-09-07 MED ORDER — DOCUSATE SODIUM 100 MG PO CAPS
100.0000 mg | ORAL_CAPSULE | Freq: Two times a day (BID) | ORAL | Status: DC
  Filled 2017-09-07: qty 1

## 2017-09-07 MED ORDER — BISACODYL 10 MG RE SUPP: 10.0000 mg | Freq: Every day | RECTAL | Status: DC | PRN

## 2017-09-07 MED ORDER — ADULT MULTIVITAMIN W/MINERALS CH: 1.0000 | ORAL_TABLET | Freq: Every day | ORAL | Status: DC

## 2017-09-07 MED ORDER — HYDROMORPHONE HCL 1 MG/ML IJ SOLN
0.5000 mg | Freq: Once | INTRAMUSCULAR | Status: AC
  Administered 2017-09-07: 0.5 mg via INTRAVENOUS
  Filled 2017-09-07: qty 1

## 2017-09-07 MED ORDER — FLEET ENEMA 7-19 GM/118ML RE ENEM
1.0000 | ENEMA | Freq: Once | RECTAL | Status: DC | PRN
Start: 2017-09-07 — End: 2017-09-08

## 2017-09-07 MED ORDER — SODIUM CHLORIDE 0.9 % IV BOLUS (SEPSIS)
1000.0000 mL | Freq: Once | INTRAVENOUS | Status: AC
  Administered 2017-09-07: 1000 mL via INTRAVENOUS

## 2017-09-07 MED ORDER — POLYETHYLENE GLYCOL 3350 17 G PO PACK: 17.0000 g | PACK | Freq: Every day | ORAL | Status: DC | PRN

## 2017-09-07 MED ORDER — HYDROCODONE-ACETAMINOPHEN 5-325 MG PO TABS: 1.0000 | ORAL_TABLET | ORAL | Status: DC | PRN

## 2017-09-07 MED ORDER — FENTANYL CITRATE (PF) 100 MCG/2ML IJ SOLN
100.0000 ug | Freq: Once | INTRAMUSCULAR | Status: AC
  Administered 2017-09-07: 100 ug via INTRAVENOUS

## 2017-09-07 MED ORDER — HYDROMORPHONE HCL 1 MG/ML IJ SOLN
0.5000 mg | INTRAMUSCULAR | Status: AC | PRN
  Administered 2017-09-07 – 2017-09-08 (×3): 0.5 mg via INTRAVENOUS
  Filled 2017-09-07: qty 0.5
  Filled 2017-09-07: qty 1
  Filled 2017-09-07: qty 0.5

## 2017-09-07 MED ORDER — ONDANSETRON HCL 4 MG PO TABS: 4.0000 mg | ORAL_TABLET | Freq: Four times a day (QID) | ORAL | Status: DC | PRN

## 2017-09-07 MED ORDER — SODIUM CHLORIDE 0.9% FLUSH
3.0000 mL | Freq: Two times a day (BID) | INTRAVENOUS | Status: DC
  Administered 2017-09-07: 3 mL via INTRAVENOUS

## 2017-09-07 MED ORDER — ACETAMINOPHEN 325 MG PO TABS: 650.0000 mg | ORAL_TABLET | Freq: Four times a day (QID) | ORAL | Status: DC | PRN

## 2017-09-07 MED ORDER — HYDROMORPHONE HCL 1 MG/ML IJ SOLN
0.7500 mg | Freq: Once | INTRAMUSCULAR | Status: AC
  Administered 2017-09-07: 0.75 mg via INTRAVENOUS
  Filled 2017-09-07: qty 1

## 2017-09-07 MED ORDER — LORAZEPAM 2 MG/ML IJ SOLN
0.5000 mg | Freq: Once | INTRAMUSCULAR | Status: AC
  Administered 2017-09-07: 0.5 mg via INTRAVENOUS
  Filled 2017-09-07: qty 1

## 2017-09-07 MED ORDER — ONDANSETRON HCL 4 MG/2ML IJ SOLN
4.0000 mg | Freq: Four times a day (QID) | INTRAMUSCULAR | Status: DC | PRN
  Administered 2017-09-08: 4 mg via INTRAVENOUS

## 2017-09-07 MED ORDER — FENTANYL CITRATE (PF) 100 MCG/2ML IJ SOLN
100.0000 ug | INTRAMUSCULAR | Status: DC | PRN
  Administered 2017-09-07: 100 ug via INTRAVENOUS
  Filled 2017-09-07 (×2): qty 2

## 2017-09-07 MED ORDER — ZOLPIDEM TARTRATE 5 MG PO TABS: 5.0000 mg | ORAL_TABLET | Freq: Every evening | ORAL | Status: DC | PRN

## 2017-09-07 MED ORDER — KCL IN DEXTROSE-NACL 20-5-0.45 MEQ/L-%-% IV SOLN
INTRAVENOUS | Status: DC
  Administered 2017-09-07: 22:00:00 via INTRAVENOUS
  Filled 2017-09-07 (×2): qty 1000

## 2017-09-07 MED ORDER — ONDANSETRON HCL 4 MG/2ML IJ SOLN
4.0000 mg | Freq: Three times a day (TID) | INTRAMUSCULAR | Status: DC | PRN
Start: 2017-09-07 — End: 2017-09-07
  Administered 2017-09-07: 4 mg via INTRAVENOUS
  Filled 2017-09-07: qty 2

## 2017-09-07 MED ORDER — HYDROMORPHONE HCL 1 MG/ML IJ SOLN: 0.5000 mg | INTRAMUSCULAR | Status: DC | PRN

## 2017-09-07 MED ORDER — ACETAMINOPHEN 650 MG RE SUPP: 650.0000 mg | Freq: Four times a day (QID) | RECTAL | Status: DC | PRN

## 2017-09-07 MED ORDER — IOPAMIDOL (ISOVUE-300) INJECTION 61%
100.0000 mL | Freq: Once | INTRAVENOUS | Status: AC | PRN
  Administered 2017-09-07: 100 mL via INTRAVENOUS

## 2017-09-07 MED ORDER — IOPAMIDOL (ISOVUE-300) INJECTION 61%
INTRAVENOUS | Status: AC
  Filled 2017-09-07: qty 100

## 2017-09-07 MED ORDER — SODIUM CHLORIDE 0.9 % IV SOLN: INTRAVENOUS | Status: AC

## 2017-09-07 NOTE — ED Notes (Signed)
[facility] picking up patient to take to [facility]. 

## 2017-09-07 NOTE — ED Triage Notes (Signed)
Patient says she "threw herself from 2nd floor" because she was cheating on her husband and didn't want him to see her. Presents with pelvic pain, leg pain, bruising on left arm.

## 2017-09-07 NOTE — ED Notes (Signed)
James from security gave patient her cash back in property envelope-had been locked up in security office while patient was here

## 2017-09-07 NOTE — ED Provider Notes (Signed)
Signed out from Dr. Juleen ChinaKohut pending CT results.  Patient has L2 burst fracture with retropulsion.  Reexamination of the patient reveals sensation fully intact.  Patient's plantarflexion and dorsiflexion bilateral feet intact.  States that she believes she landed on her feet when she jumped out of the window.  She denies any head injury or loss of consciousness.  No neck pain.  She is being kept in a flat position.  Discussed with Dr. Venetia MaxonStern who reviewed patient's images.  Advised TLSO brace and [facility].  Asked to have trauma service evaluate for admission to their service.  Dr. Michaell CowingGross will evaluate patient in the emergency department. Evaluated by surgery.  Cleared from a trauma standpoint.  Dr. Venetia MaxonStern to admit to neuro stepdown/ICU.  Temporary orders placed.   Loren RacerYelverton, Narcissa Melder, MD 09/07/17 [phone]

## 2017-09-07 NOTE — ED Notes (Signed)
Patient has $2465.00 in cash. Counted with Kennieth FrancoisWendy Munroe AD RN.

## 2017-09-07 NOTE — ED Notes (Signed)
Paged [employer] for a [employer], stat # [phone]

## 2017-09-07 NOTE — ED Provider Notes (Signed)
[facility]-EMERGENCY DEPT Provider Note   CSN: [id number] Arrival date & time: 09/07/17  1153     History   Chief Complaint Chief Complaint  Patient presents with  . Back Pain  . Fall    HPI Jennifer Chaney is a 27 y.o. female.  HPI  27 year old female presenting after fall. Pt states she "threw herself" from the second story because she was cheating on her husband and didn't want him to see her. She is not sure how exactly she fell. She is having a lot of pain primarily in her lower back and pelvis. She was able to briefly get to her feet but fell back down because of severe pain. She denies significantly pain elsewhere. She doesn't think she hit her head. Denies HA or neck pain. No numbness or tingling.   Past Medical History:  Diagnosis Date  . Pregnancy induced hypertension    with second pregnancy  . Preterm labor    pt-contractions with first pregnancy-del term  . Scoliosis 2014    Patient Active Problem List   Diagnosis Date Noted  . Contraception management 02/06/2014  . Bone and joint disorders of maternal back, pelvis, and lower limbs, antepartum(648.73) 06/24/2013  . Scoliosis 06/12/2013    Past Surgical History:  Procedure Laterality Date  . NO PAST SURGERIES      OB History    Gravida Para Term Preterm AB Living   4 3 3   1 3    SAB TAB Ectopic Multiple Live Births   1       3       Home Medications    Prior to Admission medications   Not on File    Family History History reviewed. No pertinent family history.  Social History Social History   Tobacco Use  . Smoking status: Former Games developer  . Smokeless tobacco: Never Used  . Tobacco comment:  smokes hookah some days  Substance Use Topics  . Alcohol use: Yes    Comment: occasional   . Drug use: No     Allergies   Penicillins   Review of Systems Review of Systems  All systems reviewed and negative, other than as noted in HPI.  Physical Exam Updated  Vital Signs BP 108/65   Pulse 69   Resp (!) 22   SpO2 100%   Physical Exam  Constitutional: She is oriented to person, place, and time. She appears well-developed and well-nourished. No distress.  HENT:  Head: Normocephalic and atraumatic.  Eyes: Conjunctivae are normal. Right eye exhibits no discharge. Left eye exhibits no discharge.  Neck: Neck supple.  Cardiovascular: Normal rate, regular rhythm and normal heart sounds. Exam reveals no gallop and no friction rub.  No murmur heard. Pulmonary/Chest: Effort normal and breath sounds normal. No respiratory distress.  Abdominal: Soft. She exhibits no distension. There is no tenderness.  Musculoskeletal:  No midline cervical tenderness. TTP lower thoracic/lumbar spine. Pelvis stable. Reports increased pain in lower back with ROM of either hip. No shortening or malrotation of LE. No significant tenderness of extremities but some bruising noted L forearm near [facility].   Neurological: She is alert and oriented to person, place, and time. No cranial nerve deficit or sensory deficit. She exhibits normal muscle tone. Coordination normal.  Skin: Skin is warm and dry.  Psychiatric: Her behavior is normal. Thought content normal.  Mildly anxious  Nursing note and vitals reviewed.    ED Treatments / Results  Labs (all labs ordered are  listed, but only abnormal results are displayed) Labs Reviewed  CBC WITH DIFFERENTIAL/PLATELET - Abnormal; Notable for the following components:      Result Value   WBC 14.0 (*)    Hemoglobin 16.3 (*)    HCT 46.4 (*)    Neutro Abs 12.0 (*)    All other components within normal limits  BASIC METABOLIC PANEL - Abnormal; Notable for the following components:   Calcium 8.5 (*)    All other components within normal limits  I-STAT BETA HCG BLOOD, ED ([facility], [facility], [facility] ONLY)  TYPE AND SCREEN  ABO/RH    EKG  EKG Interpretation None       Radiology Ct Chest W Contrast  Result Date: 09/07/2017 CLINICAL DATA:  Jumped  from second story, back, pelvic and leg pain, trauma EXAM: CT CHEST, ABDOMEN, AND PELVIS WITH CONTRAST TECHNIQUE: Multidetector CT imaging of the chest, abdomen and pelvis was performed following the standard protocol during bolus administration of intravenous contrast. CONTRAST:  ISOVUE-300 IOPAMIDOL (ISOVUE-300) INJECTION 61% COMPARISON:  03/03/2017 lumbar spine MRI FINDINGS: CT CHEST FINDINGS Cardiovascular: Intact thoracic aorta. Three-vessel appearing patent arch anatomy. Normal heart size. No pericardial fusion. No mediastinal hemorrhage or hematoma. Mediastinum/Nodes: No enlarged mediastinal, hilar, or axillary lymph nodes. Thyroid gland, trachea, and esophagus demonstrate no significant findings. Lungs/Pleura: Bilateral dependent basilar atelectasis. No focal pneumonia, collapse or consolidation. No pleural abnormality, effusion, or pneumothorax. Musculoskeletal: Mild scoliosis of the thoracic spine. No thoracic compression fracture. Sternum intact. Breast implants noted. No chest wall soft tissue asymmetry, hematoma, or chest wall emphysema. No displaced rib fracture. CT ABDOMEN PELVIS FINDINGS Hepatobiliary: No hepatic injury or perihepatic hematoma. Gallbladder is unremarkable Pancreas: Unremarkable. No pancreatic ductal dilatation or surrounding inflammatory changes. Spleen: No splenic injury or perisplenic hematoma. Adrenals/Urinary Tract: Normal adrenal glands. No acute urinary tract finding or injury. No hydroureter or obstructing ureteral calculus. Bladder unremarkable. Punctate nonobstructing left lower pole intrarenal calculus measures 4 mm, image 60 series 2. Stomach/Bowel: Negative for bowel obstruction, significant dilatation, ileus, or free air. Cecum is low lying in the midline of the pelvis. Appendix is visualized and unremarkable. No fluid collection or abscess. Vascular/Lymphatic: No significant vascular findings are present. No enlarged abdominal or pelvic lymph nodes. Reproductive:  Uterus and bilateral adnexa are unremarkable. Other: No abdominal wall hernia or abnormality. No abdominopelvic ascites. Musculoskeletal: Scoliosis of the spine noted. Severe acute burst fracture of L2 with significant height loss, greater than 50% and retropulsion of bony fragments resulting in mild-to-moderate spinal stenosis at this level. There is also an L2 left transverse process fracture noted, image 68 series 2. Remainder of the lumbar spine and pelvis appear intact. IMPRESSION: No acute intrathoracic finding or injury. No acute intra-abdominal or pelvic finding or injury. Severe acute L2 burst fracture with retropulsion of bony fragments resulting in mild-to-moderate spinal stenosis at this level. These results were called by telephone at the time of interpretation on 09/07/2017 at 4:47 pm to Dr. Ranae Palms, who verbally acknowledged these results. Electronically Signed   By: Judie Petit.  Shick M.D.   On: 09/07/2017 16:47   Ct Abdomen Pelvis W Contrast  Result Date: 09/07/2017 CLINICAL DATA:  Jumped from second story, back, pelvic and leg pain, trauma EXAM: CT CHEST, ABDOMEN, AND PELVIS WITH CONTRAST TECHNIQUE: Multidetector CT imaging of the chest, abdomen and pelvis was performed following the standard protocol during bolus administration of intravenous contrast. CONTRAST:  ISOVUE-300 IOPAMIDOL (ISOVUE-300) INJECTION 61% COMPARISON:  03/03/2017 lumbar spine MRI FINDINGS: CT CHEST FINDINGS Cardiovascular: Intact  thoracic aorta. Three-vessel appearing patent arch anatomy. Normal heart size. No pericardial fusion. No mediastinal hemorrhage or hematoma. Mediastinum/Nodes: No enlarged mediastinal, hilar, or axillary lymph nodes. Thyroid gland, trachea, and esophagus demonstrate no significant findings. Lungs/Pleura: Bilateral dependent basilar atelectasis. No focal pneumonia, collapse or consolidation. No pleural abnormality, effusion, or pneumothorax. Musculoskeletal: Mild scoliosis of the thoracic spine. No  thoracic compression fracture. Sternum intact. Breast implants noted. No chest wall soft tissue asymmetry, hematoma, or chest wall emphysema. No displaced rib fracture. CT ABDOMEN PELVIS FINDINGS Hepatobiliary: No hepatic injury or perihepatic hematoma. Gallbladder is unremarkable Pancreas: Unremarkable. No pancreatic ductal dilatation or surrounding inflammatory changes. Spleen: No splenic injury or perisplenic hematoma. Adrenals/Urinary Tract: Normal adrenal glands. No acute urinary tract finding or injury. No hydroureter or obstructing ureteral calculus. Bladder unremarkable. Punctate nonobstructing left lower pole intrarenal calculus measures 4 mm, image 60 series 2. Stomach/Bowel: Negative for bowel obstruction, significant dilatation, ileus, or free air. Cecum is low lying in the midline of the pelvis. Appendix is visualized and unremarkable. No fluid collection or abscess. Vascular/Lymphatic: No significant vascular findings are present. No enlarged abdominal or pelvic lymph nodes. Reproductive: Uterus and bilateral adnexa are unremarkable. Other: No abdominal wall hernia or abnormality. No abdominopelvic ascites. Musculoskeletal: Scoliosis of the spine noted. Severe acute burst fracture of L2 with significant height loss, greater than 50% and retropulsion of bony fragments resulting in mild-to-moderate spinal stenosis at this level. There is also an L2 left transverse process fracture noted, image 68 series 2. Remainder of the lumbar spine and pelvis appear intact. IMPRESSION: No acute intrathoracic finding or injury. No acute intra-abdominal or pelvic finding or injury. Severe acute L2 burst fracture with retropulsion of bony fragments resulting in mild-to-moderate spinal stenosis at this level. These results were called by telephone at the time of interpretation on 09/07/2017 at 4:47 pm to Dr. Ranae PalmsYelverton, who verbally acknowledged these results. Electronically Signed   By: Judie PetitM.  Shick M.D.   On: 09/07/2017  16:47    Procedures Procedures (including critical care time)  Medications Ordered in ED Medications  HYDROmorphone (DILAUDID) injection 0.5 mg (not administered)  sodium chloride 0.9 % bolus 1,000 mL (1,000 mLs Intravenous New Bag/Given 09/07/17 1217)  HYDROmorphone (DILAUDID) injection 0.75 mg (0.75 mg Intravenous Given 09/07/17 1321)  LORazepam (ATIVAN) injection 0.5 mg (0.5 mg Intravenous Given 09/07/17 1321)  fentaNYL (SUBLIMAZE) injection 100 mcg (100 mcg Intravenous Given 09/07/17 1251)     Initial Impression / Assessment and Plan / ED Course  I have reviewed the triage vital signs and the nursing notes.  Pertinent labs & imaging results that were available during my care of the patient were reviewed by me and considered in my medical decision making (see chart for details).     26yf with severe lower back pain after fall from second story. NVI. HD stable. Will image. Disposition pending these results.   Final Clinical Impressions(s) / ED Diagnoses   Final diagnoses:  Lumbar burst fracture, closed, initial encounter [facility](HCC)    ED Discharge Orders    None       Raeford RazorKohut, Danaye Sobh, MD 09/08/17 [phone]

## 2017-09-07 NOTE — H&P (Signed)
Reason for Consult:L 2 burst fracture with scoliosis Referring Physician: Fidela Juneau is an 27 y.o. female.  HPI: 27 year old female with history of scoliosis.  [address] from second story.  Apparently she heard her husband was cheating on her.  She cheated on him but then apparently tried to escape out the house from the second floor slipped and landed on her feet.  Felt sharp back pain.  No loss of consciousness.  No weakness.  Was walking at the scene but had sharp back pain.  History of scoliosis.  Came to emergency room.  Focal lower back tenderness but no neurological deficits.  CT scan chest abdomen pelvis shows no other maladies except for a burst fracture at L2.  Neurosurgery and trauma consultations requested.  She denies suicidal ideation.  She denies history of self-harm.  Rarely drinks alcohol.  Denies any alcohol use.  No other drug use.  Some left anterior hip soreness.  Had initially some left forearm soreness but denies it now.  Denies any tingling or weakness in her feet or toes are extremities.  She stent have some soreness on her back from her scoliosis.  Apparently she is seeing Dr. Rodell Perna for this in the past.    Past Medical History:  Diagnosis Date  . Pregnancy induced hypertension    with second pregnancy  . Preterm labor    pt-contractions with first pregnancy-del term  . Scoliosis 2014    Past Surgical History:  Procedure Laterality Date  . NO PAST SURGERIES      History reviewed. No pertinent family history.  Social History:  reports that she has quit smoking. she has never used smokeless tobacco. She reports that she drinks alcohol. She reports that she does not use drugs.  Allergies:  Allergies  Allergen Reactions  . Penicillins Other (See Comments)    Reaction unknown Has patient had a PCN reaction causing immediate rash, facial/tongue/throat swelling, SOB or lightheadedness with hypotension: unknown Has patient had a PCN reaction causing  severe rash involving mucus membranes or skin necrosis: unknown Has patient had a PCN reaction that required hospitalization: yes Has patient had a PCN reaction occurring within the last 10 years: yes - when I was in labor at the hospital If all of the above answers are "NO", then may proceed with     Medications: I have reviewed the patient's current medications.  Results for orders placed or performed during the hospital encounter of 09/07/17 (from the past 48 hour(s))  CBC with Differential     Status: Abnormal   Collection Time: 09/07/17 12:11 PM  Result Value Ref Range   WBC 14.0 (H) 4.0 - 10.5 K/uL   RBC 4.82 3.87 - 5.11 MIL/uL   Hemoglobin 16.3 (H) 12.0 - 15.0 g/dL   HCT 46.4 (H) 36.0 - 46.0 %   MCV 96.3 78.0 - 100.0 fL   MCH 33.8 26.0 - 34.0 pg   MCHC 35.1 30.0 - 36.0 g/dL   RDW 12.1 11.5 - 15.5 %   Platelets 287 150 - 400 K/uL   Neutrophils Relative % 86 %   Neutro Abs 12.0 (H) 1.7 - 7.7 K/uL   Lymphocytes Relative 10 %   Lymphs Abs 1.4 0.7 - 4.0 K/uL   Monocytes Relative 4 %   Monocytes Absolute 0.6 0.1 - 1.0 K/uL   Eosinophils Relative 0 %   Eosinophils Absolute 0.0 0.0 - 0.7 K/uL   Basophils Relative 0 %   Basophils Absolute 0.0 0.0 -  0.1 K/uL    Comment: Performed at [facility], [place] [address], [place], [place] [address]  Type and screen [place]     Status: None   Collection Time: 09/07/17 12:13 PM  Result Value Ref Range   ABO/RH(D) B POS    Antibody Screen NEG    Sample Expiration      09/10/2017 Performed at [facility], [place] [address], [place], [place] [address]   I-Stat Beta hCG blood, ED ([facility], [facility], [facility] only)     Status: None   Collection Time: 09/07/17 12:22 PM  Result Value Ref Range   I-stat hCG, quantitative <5.0 <5 mIU/mL   Comment 3            Comment:   GEST. AGE      CONC.  (mIU/mL)   <=1 WEEK        5 - 50     2 WEEKS       50 - 500     3 WEEKS       100 - 10,000     4 WEEKS      1,000 - 30,000        FEMALE AND NON-PREGNANT FEMALE:     LESS THAN 5 mIU/mL   Basic metabolic panel     Status: Abnormal   Collection Time: 09/07/17  2:35 PM  Result Value Ref Range   Sodium 136 135 - 145 mmol/L   Potassium 4.0 3.5 - 5.1 mmol/L   Chloride 103 101 - 111 mmol/L   CO2 25 22 - 32 mmol/L   Glucose, Bld 99 65 - 99 mg/dL   BUN 13 6 - 20 mg/dL   Creatinine, Ser 0.55 0.44 - 1.00 mg/dL   Calcium 8.5 (L) 8.9 - 10.3 mg/dL   GFR calc non Af Amer >60 >60 mL/min   GFR calc Af Amer >60 >60 mL/min    Comment: (NOTE) The eGFR has been calculated using the CKD EPI equation. This calculation has not been validated in all clinical situations. eGFR's persistently <60 mL/min signify possible Chronic Kidney Disease.    Anion gap 8 5 - 15    Comment: Performed at [facility], [place] [address] [place], [place] [address]    Ct Chest W Contrast  Result Date: 09/07/2017 CLINICAL DATA:  Jumped from second story, back, pelvic and leg pain, trauma EXAM: CT CHEST, ABDOMEN, AND PELVIS WITH CONTRAST TECHNIQUE: Multidetector CT imaging of the chest, abdomen and pelvis was performed following the standard protocol during bolus administration of intravenous contrast. CONTRAST:  189m ISOVUE-300 IOPAMIDOL (ISOVUE-300) INJECTION 61% COMPARISON:  03/03/2017 lumbar spine MRI FINDINGS: CT CHEST FINDINGS Cardiovascular: Intact thoracic aorta. Three-vessel appearing patent arch anatomy. Normal heart size. No pericardial fusion. No mediastinal hemorrhage or hematoma. Mediastinum/Nodes: No enlarged mediastinal, hilar, or axillary lymph nodes. Thyroid gland, trachea, and esophagus demonstrate no significant findings. Lungs/Pleura: Bilateral dependent basilar atelectasis. No focal pneumonia, collapse or consolidation. No pleural abnormality, effusion, or pneumothorax. Musculoskeletal: Mild scoliosis of the thoracic spine. No thoracic compression fracture. Sternum intact. Breast implants noted. No  chest wall soft tissue asymmetry, hematoma, or chest wall emphysema. No displaced rib fracture. CT ABDOMEN PELVIS FINDINGS Hepatobiliary: No hepatic injury or perihepatic hematoma. Gallbladder is unremarkable Pancreas: Unremarkable. No pancreatic ductal dilatation or surrounding inflammatory changes. Spleen: No splenic injury or perisplenic hematoma. Adrenals/Urinary Tract: Normal adrenal glands. No acute urinary tract finding or injury. No hydroureter or obstructing ureteral calculus.  Bladder unremarkable. Punctate nonobstructing left lower pole intrarenal calculus measures 4 mm, image 60 series 2. Stomach/Bowel: Negative for bowel obstruction, significant dilatation, ileus, or free air. Cecum is low lying in the midline of the pelvis. Appendix is visualized and unremarkable. No fluid collection or abscess. Vascular/Lymphatic: No significant vascular findings are present. No enlarged abdominal or pelvic lymph nodes. Reproductive: Uterus and bilateral adnexa are unremarkable. Other: No abdominal wall hernia or abnormality. No abdominopelvic ascites. Musculoskeletal: Scoliosis of the spine noted. Severe acute burst fracture of L2 with significant height loss, greater than 50% and retropulsion of bony fragments resulting in mild-to-moderate spinal stenosis at this level. There is also an L2 left transverse process fracture noted, image 68 series 2. Remainder of the lumbar spine and pelvis appear intact. IMPRESSION: No acute intrathoracic finding or injury. No acute intra-abdominal or pelvic finding or injury. Severe acute L2 burst fracture with retropulsion of bony fragments resulting in mild-to-moderate spinal stenosis at this level. These results were called by telephone at the time of interpretation on 09/07/2017 at 4:47 pm to Dr. Lita Mains, who verbally acknowledged these results. Electronically Signed   By: Jerilynn Mages.  Shick M.D.   On: 09/07/2017 16:47   Ct Abdomen Pelvis W Contrast  Result Date: 09/07/2017 CLINICAL  DATA:  Jumped from second story, back, pelvic and leg pain, trauma EXAM: CT CHEST, ABDOMEN, AND PELVIS WITH CONTRAST TECHNIQUE: Multidetector CT imaging of the chest, abdomen and pelvis was performed following the standard protocol during bolus administration of intravenous contrast. CONTRAST:  114m ISOVUE-300 IOPAMIDOL (ISOVUE-300) INJECTION 61% COMPARISON:  03/03/2017 lumbar spine MRI FINDINGS: CT CHEST FINDINGS Cardiovascular: Intact thoracic aorta. Three-vessel appearing patent arch anatomy. Normal heart size. No pericardial fusion. No mediastinal hemorrhage or hematoma. Mediastinum/Nodes: No enlarged mediastinal, hilar, or axillary lymph nodes. Thyroid gland, trachea, and esophagus demonstrate no significant findings. Lungs/Pleura: Bilateral dependent basilar atelectasis. No focal pneumonia, collapse or consolidation. No pleural abnormality, effusion, or pneumothorax. Musculoskeletal: Mild scoliosis of the thoracic spine. No thoracic compression fracture. Sternum intact. Breast implants noted. No chest wall soft tissue asymmetry, hematoma, or chest wall emphysema. No displaced rib fracture. CT ABDOMEN PELVIS FINDINGS Hepatobiliary: No hepatic injury or perihepatic hematoma. Gallbladder is unremarkable Pancreas: Unremarkable. No pancreatic ductal dilatation or surrounding inflammatory changes. Spleen: No splenic injury or perisplenic hematoma. Adrenals/Urinary Tract: Normal adrenal glands. No acute urinary tract finding or injury. No hydroureter or obstructing ureteral calculus. Bladder unremarkable. Punctate nonobstructing left lower pole intrarenal calculus measures 4 mm, image 60 series 2. Stomach/Bowel: Negative for bowel obstruction, significant dilatation, ileus, or free air. Cecum is low lying in the midline of the pelvis. Appendix is visualized and unremarkable. No fluid collection or abscess. Vascular/Lymphatic: No significant vascular findings are present. No enlarged abdominal or pelvic lymph nodes.  Reproductive: Uterus and bilateral adnexa are unremarkable. Other: No abdominal wall hernia or abnormality. No abdominopelvic ascites. Musculoskeletal: Scoliosis of the spine noted. Severe acute burst fracture of L2 with significant height loss, greater than 50% and retropulsion of bony fragments resulting in mild-to-moderate spinal stenosis at this level. There is also an L2 left transverse process fracture noted, image 68 series 2. Remainder of the lumbar spine and pelvis appear intact. IMPRESSION: No acute intrathoracic finding or injury. No acute intra-abdominal or pelvic finding or injury. Severe acute L2 burst fracture with retropulsion of bony fragments resulting in mild-to-moderate spinal stenosis at this level. These results were called by telephone at the time of interpretation on 09/07/2017 at 4:47 pm to Dr. YLita Mains who verbally  acknowledged these results. Electronically Signed   By: Jerilynn Mages.  Shick M.D.   On: 09/07/2017 16:47    Review of Systems - Negative except L2 fracture, scoliosis, fall from second story    Blood pressure 106/65, pulse 70, resp. rate 19, SpO2 100 %. Physical Exam  Constitutional: She is oriented to person, place, and time. She appears well-developed and well-nourished.  HENT:  Head: Normocephalic and atraumatic.  Eyes: Conjunctivae and EOM are normal. Pupils are equal, round, and reactive to light.  Neck: Normal range of motion. Neck supple.  Cardiovascular: Normal rate and regular rhythm.  Respiratory: Effort normal and breath sounds normal.  GI: Soft. Bowel sounds are normal.  Musculoskeletal: She exhibits tenderness and deformity.  Neurological: She is alert and oriented to person, place, and time. She has normal strength and normal reflexes. A sensory deficit is present. No cranial nerve deficit. GCS eye subscore is 4. GCS verbal subscore is 5. GCS motor subscore is 6.  Left thigh numbness.  Back pain with scoliotic deformity to palpation.  Normal rectal tone and  perineal sensation.    Assessment/Plan: Patient has scoliosis with L 2 burst fracture.  Cleared of other injuries by Trauma.  To OR in am for surgical repair of L 2 burst fracture ( T 12 - L 4 posterior spinal fusion with partial vertebrectomy of L 2 fracture).  Patient has some left thigh numbness and right leg pain, but normal motor exam and intact perineal sensation.  Peggyann Shoals, MD 09/07/2017, 8:03 PM

## 2017-09-07 NOTE — ED Notes (Signed)
Bed: [id number] Expected date:  Expected time:  Means of arrival:  Comments: 27 yo jump from 2nd story[facility]

## 2017-09-07 NOTE — Consult Note (Signed)
[facility]  [address] [place], [place] [address] Phone: [phone] FAX: [phone]     Dayja Loveridge  08-20-1990 [id number]  CARE TEAM:  PCP: Benito Mccreedy, MD  Outpatient Care Team: Patient Care Team: Benito Mccreedy, MD as PCP - General (Internal Medicine)  Inpatient Treatment Team: Treatment Team: Attending Provider: Virgel Manifold, MD; Technician: Lowry Bowl, NT; Technician: Demetrius Charity, NT; Registered Nurse: Toma Copier, RN; Registered Nurse: Talbot Grumbling, RN; Consulting Physician: Erline Levine, MD; Consulting Physician: Md, Trauma, MD   This patient is a 27 y.o.female who presents today for surgical evaluation at the request of Dr Lita Mains.   Chief complaint / Reason for evaluation: Fall with L2 spinal fracture.  Request for trauma evaluation  27 year old female with history of scoliosis.  [place] from second story.  Apparently she heard her husband was cheating on her.  She cheated on him but then apparently tried to escape out the house from the second floor slipped and landed on her feet.  Felt start back pain.  No loss of consciousness.  No weakness.  Was walking at the scene but had sharp back pain.  History of scoliosis.  Came to emergency room.  Focal lower back tenderness but no neurological deficits.  CT scan chest abdomen pelvis shows no other maladies except for a burst fracture at L2.  Neurosurgery and trauma consultations requested.  She denies suicidal ideation.  She denies history of self-harm.  Rarely drinks alcohol.  Denies any alcohol use.  No other drug use.  Some left anterior hip soreness.  Had initially some left forearm soreness but denies it now.  Denies any tingling or weakness in her feet or toes are extremities.  She stent have some soreness on her back from her scoliosis.  Apparently she is seeing Dr. Rodell Perna for this in the past.     Assessment  Angelia  Hardigree  27 y.o. female       Problem List:  Principal Problem:   L2 Lumbar burst fracture  Active Problems:   Scoliosis   Fall from building   Lumbar transverse process fracture [facility])   Fall with isolated L2 fracture  Plan:  -No evidence of any chest abdomen pelvis major intra-abdominal pathology.  No need for trauma admission.  Discussed with assistant trauma director, Dr. Georganna Skeans, whom agrees.  Trauma service can be available as needed.  No evidence of suicidal ideation or psychiatric abnormality at this time, so will defer social work involvement to admitting service, neurosurgery.  Discussed with Dr. Duffy Bruce who is covering for Dr. Lita Mains at the time of my evaluation.  Defer to spinal management with neurosurgery.  Apparently plan is for TLSO brace stabilization with pain control and physical therapy.  -VTE prophylaxis- SCDs, etc -mobilize as tolerated to help recovery  35 minutes spent in review, evaluation, examination, counseling, and coordination of care.  More than 50% of that time was spent in counseling.  Adin Hector, M.D., F.A.C.S. Gastrointestinal and Minimally Invasive Surgery [facility] [address], [place], [place] [address] [phone] Main / Paging   09/07/2017      Past Medical History:  Diagnosis Date  . Pregnancy induced hypertension    with second pregnancy  . Preterm labor    pt-contractions with first pregnancy-del term  . Scoliosis 2014    Past Surgical History:  Procedure Laterality Date  . NO PAST SURGERIES  Social History   Socioeconomic History  . Marital status: Married    Spouse name: Not on file  . Number of children: Not on file  . Years of education: Not on file  . Highest education level: Not on file  Social Needs  . Financial resource strain: Not on file  . Food insecurity - worry: Not on file  . Food insecurity - inability: Not on file  . Transportation  needs - medical: Not on file  . Transportation needs - non-medical: Not on file  Occupational History  . Not on file  Tobacco Use  . Smoking status: Former Research scientist (life sciences)  . Smokeless tobacco: Never Used  . Tobacco comment:  smokes hookah some days  Substance and Sexual Activity  . Alcohol use: Yes    Comment: occasional   . Drug use: No  . Sexual activity: Yes    Birth control/protection: None  Other Topics Concern  . Not on file  Social History Narrative  . Not on file    History reviewed. No pertinent family history.  Current Facility-Administered Medications  Medication Dose Route Frequency Provider Last Rate Last Dose  . iopamidol (ISOVUE-300) 61 % injection            No current outpatient medications on file.     Allergies  Allergen Reactions  . Penicillins Other (See Comments)    Reaction unknown Has patient had a PCN reaction causing immediate rash, facial/tongue/throat swelling, SOB or lightheadedness with hypotension: unknown Has patient had a PCN reaction causing severe rash involving mucus membranes or skin necrosis: unknown Has patient had a PCN reaction that required hospitalization: yes Has patient had a PCN reaction occurring within the last 10 years: yes - when I was in labor at the hospital If all of the above answers are "NO", then may proceed with     ROS:   All other systems reviewed & are negative except per HPI or as noted below: Constitutional:  No fevers, chills, sweats.  Weight stable Eyes:  No vision changes, No discharge.   HENT:  No sore throats, nasal drainage.  No blood from tympanic membranes.   Neck; no neck pain.  No swelling.  No thyroid abnormalities. Pulmonary:  No cough, productive sputum CV: No orthopnea, PND  Patient walks 15 minutes for about 0.5 miles without difficulty.  No exertional chest/neck/shoulder/arm pain. GI: No personal nor family history of GI/colon cancer, inflammatory bowel disease, irritable bowel syndrome, allergy such  as Celiac Sprue, dietary/dairy problems, colitis, ulcers nor gastritis.  No recent sick contacts/gastroenteritis.  No travel outside the country.  No changes in diet. Renal: No UTIs, No hematuria Genital:  No drainage, bleeding, masses Musculoskeletal: Moderately severe lower back pain focal.  No severe joint pain.  Good ROM major joints Skin:  No sores or lesions.  No rashes Heme/Lymph:  No easy bleeding.  No swollen lymph nodes Neuro: No focal weakness/numbness.  No seizures Psych: No suicidal ideation.  No hallucinations.  BP 110/71   Pulse 96   Resp 20   SpO2 100%   Physical Exam: General: Pt awake/alert/oriented x4 in no major acute distress.  Using smart phone, checking email. Eyes: PERRL, normal EOM. Sclera nonicteric Neuro: CN II-XII intact w/o focal sensory/motor deficits.  Hand grip 5 out of 5.  Normal dorsiflexion and plantar flexion of toes.  No sensory deficits. Lymph: No head/neck/groin lymphadenopathy Psych:  No delerium/psychosis/paranoia.  Calm.  Not anxious.  Not tearful. HENT: Normocephalic, Mucus membranes moist.  No thrush.  Midface stable.  No malocclusion.  No raccoon eyes nor battle sign. No external auditory meatal blood. Neck: Supple, No tracheal deviation. Not in c-collar.  No tracheal deviation.  No JVD.  Normal passive and active range of motion.  Clinically cleared. Chest: Mild soreness on posterior rib cage but no step-off.  No sternal click.  No obvious rib fracture.  Good respiratory excursion. CV:  Pulses intact.  Regular rhythm. Abdomen: Soft, Nondistended.  Nontender.  No incarcerated hernias. Pelvis stable.  Mild soreness on anterior superior iliac spine only.  Not at left hip. Gen:  No inguinal hernias.  No inguinal lymphadenopathy.   Ext:  SCDs BLE.  No significant edema.  No cyanosis Skin: No petechiae / purpurea.  No major sores Musculoskeletal: Tenderness at mid lumbar spine focal.  Mild discomfort on upper lumbar spine.  Less so on thoracic.  No  cervical pain.  No pain in upper extremities with normal range of motion on shoulders elbows wrists.  Normal range of motion at hips knees and ankles.  No hematoma.  No swelling.  No heel pain.     Results:   Labs: Results for orders placed or performed during the hospital encounter of 09/07/17 (from the past 48 hour(s))  CBC with Differential     Status: Abnormal   Collection Time: 09/07/17 12:11 PM  Result Value Ref Range   WBC 14.0 (H) 4.0 - 10.5 K/uL   RBC 4.82 3.87 - 5.11 MIL/uL   Hemoglobin 16.3 (H) 12.0 - 15.0 g/dL   HCT 46.4 (H) 36.0 - 46.0 %   MCV 96.3 78.0 - 100.0 fL   MCH 33.8 26.0 - 34.0 pg   MCHC 35.1 30.0 - 36.0 g/dL   RDW 12.1 11.5 - 15.5 %   Platelets 287 150 - 400 K/uL   Neutrophils Relative % 86 %   Neutro Abs 12.0 (H) 1.7 - 7.7 K/uL   Lymphocytes Relative 10 %   Lymphs Abs 1.4 0.7 - 4.0 K/uL   Monocytes Relative 4 %   Monocytes Absolute 0.6 0.1 - 1.0 K/uL   Eosinophils Relative 0 %   Eosinophils Absolute 0.0 0.0 - 0.7 K/uL   Basophils Relative 0 %   Basophils Absolute 0.0 0.0 - 0.1 K/uL    Comment: Performed at [facility], [place] [address], [place], [place] [address]  Type and screen [place]     Status: None   Collection Time: 09/07/17 12:13 PM  Result Value Ref Range   ABO/RH(D) B POS    Antibody Screen NEG    Sample Expiration      09/10/2017 Performed at [facility], [place] [address], [place], [place] [address]   I-Stat Beta hCG blood, ED ([facility], [facility], [facility] only)     Status: None   Collection Time: 09/07/17 12:22 PM  Result Value Ref Range   I-stat hCG, quantitative <5.0 <5 mIU/mL   Comment 3            Comment:   GEST. AGE      CONC.  (mIU/mL)   <=1 WEEK        5 - 50     2 WEEKS       50 - 500     3 WEEKS       100 - 10,000     4 WEEKS     1,000 - 30,000        FEMALE AND NON-PREGNANT  FEMALE:     LESS THAN 5 mIU/mL   Basic metabolic panel     Status: Abnormal   Collection Time: 09/07/17   2:35 PM  Result Value Ref Range   Sodium 136 135 - 145 mmol/L   Potassium 4.0 3.5 - 5.1 mmol/L   Chloride 103 101 - 111 mmol/L   CO2 25 22 - 32 mmol/L   Glucose, Bld 99 65 - 99 mg/dL   BUN 13 6 - 20 mg/dL   Creatinine, Ser 0.55 0.44 - 1.00 mg/dL   Calcium 8.5 (L) 8.9 - 10.3 mg/dL   GFR calc non Af Amer >60 >60 mL/min   GFR calc Af Amer >60 >60 mL/min    Comment: (NOTE) The eGFR has been calculated using the CKD EPI equation. This calculation has not been validated in all clinical situations. eGFR's persistently <60 mL/min signify possible Chronic Kidney Disease.    Anion gap 8 5 - 15    Comment: Performed at [facility], [place] [address], [place], [place] [address]    Imaging / Studies: Ct Chest W Contrast  Result Date: 09/07/2017 CLINICAL DATA:  Jumped from second story, back, pelvic and leg pain, trauma EXAM: CT CHEST, ABDOMEN, AND PELVIS WITH CONTRAST TECHNIQUE: Multidetector CT imaging of the chest, abdomen and pelvis was performed following the standard protocol during bolus administration of intravenous contrast. CONTRAST:  154m ISOVUE-300 IOPAMIDOL (ISOVUE-300) INJECTION 61% COMPARISON:  03/03/2017 lumbar spine MRI FINDINGS: CT CHEST FINDINGS Cardiovascular: Intact thoracic aorta. Three-vessel appearing patent arch anatomy. Normal heart size. No pericardial fusion. No mediastinal hemorrhage or hematoma. Mediastinum/Nodes: No enlarged mediastinal, hilar, or axillary lymph nodes. Thyroid gland, trachea, and esophagus demonstrate no significant findings. Lungs/Pleura: Bilateral dependent basilar atelectasis. No focal pneumonia, collapse or consolidation. No pleural abnormality, effusion, or pneumothorax. Musculoskeletal: Mild scoliosis of the thoracic spine. No thoracic compression fracture. Sternum intact. Breast implants noted. No chest wall soft tissue asymmetry, hematoma, or chest wall emphysema. No displaced rib fracture. CT ABDOMEN PELVIS FINDINGS Hepatobiliary:  No hepatic injury or perihepatic hematoma. Gallbladder is unremarkable Pancreas: Unremarkable. No pancreatic ductal dilatation or surrounding inflammatory changes. Spleen: No splenic injury or perisplenic hematoma. Adrenals/Urinary Tract: Normal adrenal glands. No acute urinary tract finding or injury. No hydroureter or obstructing ureteral calculus. Bladder unremarkable. Punctate nonobstructing left lower pole intrarenal calculus measures 4 mm, image 60 series 2. Stomach/Bowel: Negative for bowel obstruction, significant dilatation, ileus, or free air. Cecum is low lying in the midline of the pelvis. Appendix is visualized and unremarkable. No fluid collection or abscess. Vascular/Lymphatic: No significant vascular findings are present. No enlarged abdominal or pelvic lymph nodes. Reproductive: Uterus and bilateral adnexa are unremarkable. Other: No abdominal wall hernia or abnormality. No abdominopelvic ascites. Musculoskeletal: Scoliosis of the spine noted. Severe acute burst fracture of L2 with significant height loss, greater than 50% and retropulsion of bony fragments resulting in mild-to-moderate spinal stenosis at this level. There is also an L2 left transverse process fracture noted, image 68 series 2. Remainder of the lumbar spine and pelvis appear intact. IMPRESSION: No acute intrathoracic finding or injury. No acute intra-abdominal or pelvic finding or injury. Severe acute L2 burst fracture with retropulsion of bony fragments resulting in mild-to-moderate spinal stenosis at this level. These results were called by telephone at the time of interpretation on 09/07/2017 at 4:47 pm to Dr. YLita Mains who verbally acknowledged these results. Electronically Signed   By: MJerilynn Mages  Shick M.D.   On: 09/07/2017 16:47   Ct Abdomen Pelvis  W Contrast  Result Date: 09/07/2017 CLINICAL DATA:  Jumped from second story, back, pelvic and leg pain, trauma EXAM: CT CHEST, ABDOMEN, AND PELVIS WITH CONTRAST TECHNIQUE: Multidetector  CT imaging of the chest, abdomen and pelvis was performed following the standard protocol during bolus administration of intravenous contrast. CONTRAST:  183m ISOVUE-300 IOPAMIDOL (ISOVUE-300) INJECTION 61% COMPARISON:  03/03/2017 lumbar spine MRI FINDINGS: CT CHEST FINDINGS Cardiovascular: Intact thoracic aorta. Three-vessel appearing patent arch anatomy. Normal heart size. No pericardial fusion. No mediastinal hemorrhage or hematoma. Mediastinum/Nodes: No enlarged mediastinal, hilar, or axillary lymph nodes. Thyroid gland, trachea, and esophagus demonstrate no significant findings. Lungs/Pleura: Bilateral dependent basilar atelectasis. No focal pneumonia, collapse or consolidation. No pleural abnormality, effusion, or pneumothorax. Musculoskeletal: Mild scoliosis of the thoracic spine. No thoracic compression fracture. Sternum intact. Breast implants noted. No chest wall soft tissue asymmetry, hematoma, or chest wall emphysema. No displaced rib fracture. CT ABDOMEN PELVIS FINDINGS Hepatobiliary: No hepatic injury or perihepatic hematoma. Gallbladder is unremarkable Pancreas: Unremarkable. No pancreatic ductal dilatation or surrounding inflammatory changes. Spleen: No splenic injury or perisplenic hematoma. Adrenals/Urinary Tract: Normal adrenal glands. No acute urinary tract finding or injury. No hydroureter or obstructing ureteral calculus. Bladder unremarkable. Punctate nonobstructing left lower pole intrarenal calculus measures 4 mm, image 60 series 2. Stomach/Bowel: Negative for bowel obstruction, significant dilatation, ileus, or free air. Cecum is low lying in the midline of the pelvis. Appendix is visualized and unremarkable. No fluid collection or abscess. Vascular/Lymphatic: No significant vascular findings are present. No enlarged abdominal or pelvic lymph nodes. Reproductive: Uterus and bilateral adnexa are unremarkable. Other: No abdominal wall hernia or abnormality. No abdominopelvic ascites.  Musculoskeletal: Scoliosis of the spine noted. Severe acute burst fracture of L2 with significant height loss, greater than 50% and retropulsion of bony fragments resulting in mild-to-moderate spinal stenosis at this level. There is also an L2 left transverse process fracture noted, image 68 series 2. Remainder of the lumbar spine and pelvis appear intact. IMPRESSION: No acute intrathoracic finding or injury. No acute intra-abdominal or pelvic finding or injury. Severe acute L2 burst fracture with retropulsion of bony fragments resulting in mild-to-moderate spinal stenosis at this level. These results were called by telephone at the time of interpretation on 09/07/2017 at 4:47 pm to Dr. YLita Mains who verbally acknowledged these results. Electronically Signed   By: MJerilynn Mages  Shick M.D.   On: 09/07/2017 16:47    Medications / Allergies: per chart  Antibiotics: Anti-infectives (From admission, onward)   None        Note: Portions of this report may have been transcribed using voice recognition software. Every effort was made to ensure accuracy; however, inadvertent computerized transcription errors may be present.   Any transcriptional errors that result from this process are unintentional.    SAdin Hector M.D., F.A.C.S. Gastrointestinal and Minimally Invasive Surgery [facility] [address] [place] [phone] / Paging   09/07/2017

## 2017-09-08 ENCOUNTER — Inpatient Hospital Stay (HOSPITAL_COMMUNITY): Payer: Medicaid Other | Admitting: Anesthesiology

## 2017-09-08 ENCOUNTER — Encounter (HOSPITAL_COMMUNITY)
Admission: EM | Disposition: A | Discharge status: Home / Self Care | Payer: Self-pay | Source: Home / Self Care | Attending: Neurosurgery

## 2017-09-08 ENCOUNTER — Inpatient Hospital Stay (HOSPITAL_COMMUNITY): Payer: Medicaid Other

## 2017-09-08 HISTORY — PX: POSTERIOR LUMBAR FUSION 4 LEVEL: SHX6037

## 2017-09-08 LAB — HIV ANTIBODY (ROUTINE TESTING W REFLEX): HIV Screen 4th Generation wRfx: NONREACTIVE

## 2017-09-08 LAB — SURGICAL PCR SCREEN
MRSA, PCR: NEGATIVE
Staphylococcus aureus: NEGATIVE

## 2017-09-08 LAB — ABO/RH: ABO/RH(D): B POS

## 2017-09-08 LAB — PREPARE RBC (CROSSMATCH)

## 2017-09-08 SURGERY — POSTERIOR LUMBAR FUSION 4 LEVEL
Anesthesia: General | Site: Back

## 2017-09-08 MED ORDER — SUFENTANIL CITRATE 50 MCG/ML IV SOLN
INTRAVENOUS | Status: DC | PRN
  Administered 2017-09-08 (×2): 5 ug via INTRAVENOUS
  Administered 2017-09-08: 15 ug via INTRAVENOUS
  Administered 2017-09-08 (×3): 5 ug via INTRAVENOUS

## 2017-09-08 MED ORDER — BUPIVACAINE LIPOSOME 1.3 % IJ SUSP
INTRAMUSCULAR | Status: DC | PRN
  Administered 2017-09-08: 20 mL

## 2017-09-08 MED ORDER — POLYETHYLENE GLYCOL 3350 17 G PO PACK
17.0000 g | PACK | Freq: Every day | ORAL | Status: DC | PRN
  Administered 2017-09-09: 17 g via ORAL
  Filled 2017-09-08 (×2): qty 1

## 2017-09-08 MED ORDER — THROMBIN (RECOMBINANT) 20000 UNITS EX SOLR
CUTANEOUS | Status: DC | PRN
  Administered 2017-09-08: 20 mL via TOPICAL

## 2017-09-08 MED ORDER — NEOSTIGMINE METHYLSULFATE 10 MG/10ML IV SOLN
INTRAVENOUS | Status: DC | PRN
  Administered 2017-09-08: 2 mg via INTRAVENOUS

## 2017-09-08 MED ORDER — LIDOCAINE 2% (20 MG/ML) 5 ML SYRINGE
INTRAMUSCULAR | Status: AC
  Filled 2017-09-08: qty 5

## 2017-09-08 MED ORDER — 0.9 % SODIUM CHLORIDE (POUR BTL) OPTIME
TOPICAL | Status: DC | PRN
  Administered 2017-09-08: 1000 mL

## 2017-09-08 MED ORDER — ACETAMINOPHEN 160 MG/5ML PO SOLN: 325.0000 mg | ORAL | Status: DC | PRN

## 2017-09-08 MED ORDER — BUPIVACAINE HCL (PF) 0.5 % IJ SOLN
INTRAMUSCULAR | Status: AC
  Filled 2017-09-08: qty 30

## 2017-09-08 MED ORDER — ALUM & MAG HYDROXIDE-SIMETH 200-200-20 MG/5ML PO SUSP: 30.0000 mL | Freq: Four times a day (QID) | ORAL | Status: DC | PRN

## 2017-09-08 MED ORDER — FLEET ENEMA 7-19 GM/118ML RE ENEM: 1.0000 | ENEMA | Freq: Once | RECTAL | Status: DC | PRN

## 2017-09-08 MED ORDER — THROMBIN 20000 UNITS EX SOLR
CUTANEOUS | Status: AC
  Filled 2017-09-08: qty 20000

## 2017-09-08 MED ORDER — GLYCOPYRROLATE 0.2 MG/ML IJ SOLN
INTRAMUSCULAR | Status: DC | PRN
  Administered 2017-09-08: 0.2 mg via INTRAVENOUS

## 2017-09-08 MED ORDER — THROMBIN 5000 UNITS EX SOLR
CUTANEOUS | Status: AC
  Filled 2017-09-08: qty 5000

## 2017-09-08 MED ORDER — GABAPENTIN 300 MG PO CAPS
300.0000 mg | ORAL_CAPSULE | Freq: Three times a day (TID) | ORAL | Status: DC
  Administered 2017-09-08 – 2017-09-13 (×15): 300 mg via ORAL
  Filled 2017-09-08 (×15): qty 1

## 2017-09-08 MED ORDER — ONDANSETRON HCL 4 MG/2ML IJ SOLN
INTRAMUSCULAR | Status: AC
  Filled 2017-09-08: qty 2

## 2017-09-08 MED ORDER — ACETAMINOPHEN 325 MG PO TABS: 325.0000 mg | ORAL_TABLET | ORAL | Status: DC | PRN

## 2017-09-08 MED ORDER — OXYCODONE HCL 5 MG PO TABS: 5.0000 mg | ORAL_TABLET | Freq: Once | ORAL | Status: DC | PRN

## 2017-09-08 MED ORDER — SUCCINYLCHOLINE CHLORIDE 200 MG/10ML IV SOSY
PREFILLED_SYRINGE | INTRAVENOUS | Status: AC
  Filled 2017-09-08: qty 10

## 2017-09-08 MED ORDER — SODIUM CHLORIDE 0.9% FLUSH: 3.0000 mL | INTRAVENOUS | Status: DC | PRN

## 2017-09-08 MED ORDER — ACETAMINOPHEN 650 MG RE SUPP: 650.0000 mg | RECTAL | Status: DC | PRN

## 2017-09-08 MED ORDER — SUFENTANIL CITRATE 50 MCG/ML IV SOLN
INTRAVENOUS | Status: AC
  Filled 2017-09-08: qty 1

## 2017-09-08 MED ORDER — SODIUM CHLORIDE 0.9 % IV SOLN
250.0000 mL | INTRAVENOUS | Status: DC
  Administered 2017-09-13: 250 mL via INTRAVENOUS

## 2017-09-08 MED ORDER — VANCOMYCIN HCL 1000 MG IV SOLR
INTRAVENOUS | Status: AC
Start: 2017-09-08 — End: 2017-09-08
  Filled 2017-09-08: qty 1000

## 2017-09-08 MED ORDER — ALBUMIN HUMAN 5 % IV SOLN: 12.5000 g | INTRAVENOUS | Status: DC

## 2017-09-08 MED ORDER — METHOCARBAMOL 500 MG PO TABS
500.0000 mg | ORAL_TABLET | Freq: Four times a day (QID) | ORAL | Status: DC | PRN
  Administered 2017-09-11 – 2017-09-13 (×3): 500 mg via ORAL
  Filled 2017-09-08 (×5): qty 1

## 2017-09-08 MED ORDER — PROPOFOL 10 MG/ML IV BOLUS
INTRAVENOUS | Status: DC | PRN
  Administered 2017-09-08: 50 mg via INTRAVENOUS
  Administered 2017-09-08: 100 mg via INTRAVENOUS

## 2017-09-08 MED ORDER — HYDROMORPHONE HCL 1 MG/ML IJ SOLN
0.2500 mg | INTRAMUSCULAR | Status: DC | PRN
  Administered 2017-09-08 (×2): 0.5 mg via INTRAVENOUS

## 2017-09-08 MED ORDER — MIDAZOLAM HCL 5 MG/5ML IJ SOLN
INTRAMUSCULAR | Status: DC | PRN
  Administered 2017-09-08: 2 mg via INTRAVENOUS

## 2017-09-08 MED ORDER — SODIUM CHLORIDE 0.9% FLUSH
3.0000 mL | Freq: Two times a day (BID) | INTRAVENOUS | Status: DC
  Administered 2017-09-09 – 2017-09-12 (×7): 3 mL via INTRAVENOUS

## 2017-09-08 MED ORDER — DOCUSATE SODIUM 100 MG PO CAPS
100.0000 mg | ORAL_CAPSULE | Freq: Two times a day (BID) | ORAL | Status: DC
  Administered 2017-09-08 – 2017-09-12 (×9): 100 mg via ORAL
  Filled 2017-09-08 (×10): qty 1

## 2017-09-08 MED ORDER — PANTOPRAZOLE SODIUM 40 MG IV SOLR
40.0000 mg | Freq: Every day | INTRAVENOUS | Status: DC
  Administered 2017-09-08 – 2017-09-09 (×2): 40 mg via INTRAVENOUS
  Filled 2017-09-08 (×2): qty 40

## 2017-09-08 MED ORDER — SODIUM CHLORIDE 0.9 % IV SOLN
1500.0000 mg | Freq: Two times a day (BID) | INTRAVENOUS | Status: DC
  Administered 2017-09-08 – 2017-09-13 (×11): 1500 mg via INTRAVENOUS
  Filled 2017-09-08 (×10): qty 1500

## 2017-09-08 MED ORDER — OXYCODONE HCL 5 MG PO TABS
5.0000 mg | ORAL_TABLET | ORAL | Status: DC | PRN
  Administered 2017-09-10 – 2017-09-13 (×7): 5 mg via ORAL
  Filled 2017-09-08 (×7): qty 1

## 2017-09-08 MED ORDER — BUPIVACAINE HCL (PF) 0.5 % IJ SOLN
INTRAMUSCULAR | Status: DC | PRN
  Administered 2017-09-08: 10 mL

## 2017-09-08 MED ORDER — MIDAZOLAM HCL 2 MG/2ML IJ SOLN
INTRAMUSCULAR | Status: AC
  Filled 2017-09-08: qty 2

## 2017-09-08 MED ORDER — HYDROMORPHONE HCL 1 MG/ML IJ SOLN
1.0000 mg | INTRAMUSCULAR | Status: DC | PRN
  Administered 2017-09-08 – 2017-09-12 (×18): 1 mg via INTRAVENOUS
  Filled 2017-09-08 (×18): qty 1

## 2017-09-08 MED ORDER — HYDROMORPHONE HCL 1 MG/ML IJ SOLN
INTRAMUSCULAR | Status: AC
  Administered 2017-09-08: 0.5 mg via INTRAVENOUS
  Filled 2017-09-08: qty 1

## 2017-09-08 MED ORDER — ALBUMIN HUMAN 5 % IV SOLN
INTRAVENOUS | Status: DC | PRN
  Administered 2017-09-08: 10:00:00 via INTRAVENOUS

## 2017-09-08 MED ORDER — OXYCODONE HCL 5 MG/5ML PO SOLN: 5.0000 mg | Freq: Once | ORAL | Status: DC | PRN

## 2017-09-08 MED ORDER — PHENYLEPHRINE HCL 10 MG/ML IJ SOLN
INTRAVENOUS | Status: DC | PRN
  Administered 2017-09-08: 40 ug/min via INTRAVENOUS

## 2017-09-08 MED ORDER — VANCOMYCIN HCL IN DEXTROSE 1-5 GM/200ML-% IV SOLN
INTRAVENOUS | Status: AC
  Filled 2017-09-08: qty 200

## 2017-09-08 MED ORDER — SODIUM CHLORIDE 0.9 % IV SOLN: Freq: Once | INTRAVENOUS | Status: DC

## 2017-09-08 MED ORDER — THROMBIN (RECOMBINANT) 5000 UNITS EX SOLR
OROMUCOSAL | Status: DC | PRN
  Administered 2017-09-08: 5 mL via TOPICAL

## 2017-09-08 MED ORDER — DEXAMETHASONE SODIUM PHOSPHATE 10 MG/ML IJ SOLN
INTRAMUSCULAR | Status: DC | PRN
  Administered 2017-09-08: 10 mg via INTRAVENOUS

## 2017-09-08 MED ORDER — ROCURONIUM BROMIDE 100 MG/10ML IV SOLN
INTRAVENOUS | Status: DC | PRN
  Administered 2017-09-08 (×2): 20 mg via INTRAVENOUS
  Administered 2017-09-08: 50 mg via INTRAVENOUS

## 2017-09-08 MED ORDER — NEOSTIGMINE METHYLSULFATE 5 MG/5ML IV SOSY
PREFILLED_SYRINGE | INTRAVENOUS | Status: AC
  Filled 2017-09-08: qty 5

## 2017-09-08 MED ORDER — BUPIVACAINE LIPOSOME 1.3 % IJ SUSP
20.0000 mL | Freq: Once | INTRAMUSCULAR | Status: DC
  Filled 2017-09-08 (×2): qty 20

## 2017-09-08 MED ORDER — MENTHOL 3 MG MT LOZG: 1.0000 | LOZENGE | OROMUCOSAL | Status: DC | PRN

## 2017-09-08 MED ORDER — LIDOCAINE-EPINEPHRINE 1 %-1:100000 IJ SOLN
INTRAMUSCULAR | Status: AC
  Filled 2017-09-08: qty 1

## 2017-09-08 MED ORDER — DEXAMETHASONE SODIUM PHOSPHATE 10 MG/ML IJ SOLN
INTRAMUSCULAR | Status: AC
  Filled 2017-09-08: qty 1

## 2017-09-08 MED ORDER — KCL IN DEXTROSE-NACL 20-5-0.45 MEQ/L-%-% IV SOLN
INTRAVENOUS | Status: DC
  Administered 2017-09-08 – 2017-09-09 (×3): via INTRAVENOUS
  Filled 2017-09-08 (×3): qty 1000

## 2017-09-08 MED ORDER — SODIUM CHLORIDE 0.9 % IJ SOLN
INTRAMUSCULAR | Status: AC
  Filled 2017-09-08: qty 10

## 2017-09-08 MED ORDER — PHENYLEPHRINE 40 MCG/ML (10ML) SYRINGE FOR IV PUSH (FOR BLOOD PRESSURE SUPPORT)
PREFILLED_SYRINGE | INTRAVENOUS | Status: AC
  Filled 2017-09-08: qty 10

## 2017-09-08 MED ORDER — ROCURONIUM BROMIDE 50 MG/5ML IV SOLN
INTRAVENOUS | Status: AC
  Filled 2017-09-08: qty 2

## 2017-09-08 MED ORDER — LIDOCAINE-EPINEPHRINE 1 %-1:100000 IJ SOLN
INTRAMUSCULAR | Status: DC | PRN
  Administered 2017-09-08: 10 mL via INTRADERMAL

## 2017-09-08 MED ORDER — ONDANSETRON HCL 4 MG PO TABS
4.0000 mg | ORAL_TABLET | Freq: Four times a day (QID) | ORAL | Status: DC | PRN
  Administered 2017-09-09 – 2017-09-10 (×2): 4 mg via ORAL
  Filled 2017-09-08 (×2): qty 1

## 2017-09-08 MED ORDER — LIDOCAINE HCL (CARDIAC) 20 MG/ML IV SOLN
INTRAVENOUS | Status: DC | PRN
  Administered 2017-09-08: 50 mg via INTRAVENOUS

## 2017-09-08 MED ORDER — EPHEDRINE 5 MG/ML INJ
INTRAVENOUS | Status: AC
  Filled 2017-09-08: qty 10

## 2017-09-08 MED ORDER — ACETAMINOPHEN 325 MG PO TABS: 650.0000 mg | ORAL_TABLET | ORAL | Status: DC | PRN

## 2017-09-08 MED ORDER — ROCURONIUM BROMIDE 50 MG/5ML IV SOLN
INTRAVENOUS | Status: AC
  Filled 2017-09-08: qty 1

## 2017-09-08 MED ORDER — VANCOMYCIN HCL 1000 MG IV SOLR
INTRAVENOUS | Status: DC | PRN
  Administered 2017-09-08: 1000 mg via INTRAVENOUS

## 2017-09-08 MED ORDER — BISACODYL 10 MG RE SUPP: 10.0000 mg | Freq: Every day | RECTAL | Status: DC | PRN

## 2017-09-08 MED ORDER — METHOCARBAMOL 1000 MG/10ML IJ SOLN
500.0000 mg | Freq: Four times a day (QID) | INTRAVENOUS | Status: DC | PRN
  Administered 2017-09-08: 500 mg via INTRAVENOUS
  Filled 2017-09-08: qty 5

## 2017-09-08 MED ORDER — ONDANSETRON HCL 4 MG/2ML IJ SOLN
4.0000 mg | Freq: Four times a day (QID) | INTRAMUSCULAR | Status: DC | PRN
  Administered 2017-09-09: 4 mg via INTRAVENOUS
  Filled 2017-09-08: qty 2

## 2017-09-08 MED ORDER — ZOLPIDEM TARTRATE 5 MG PO TABS: 5.0000 mg | ORAL_TABLET | Freq: Every evening | ORAL | Status: DC | PRN

## 2017-09-08 MED ORDER — HYDROCODONE-ACETAMINOPHEN 5-325 MG PO TABS
2.0000 | ORAL_TABLET | ORAL | Status: DC | PRN
  Administered 2017-09-08 – 2017-09-13 (×13): 2 via ORAL
  Filled 2017-09-08 (×13): qty 2

## 2017-09-08 MED ORDER — PHENOL 1.4 % MT LIQD: 1.0000 | OROMUCOSAL | Status: DC | PRN

## 2017-09-08 MED ORDER — PROPOFOL 10 MG/ML IV BOLUS
INTRAVENOUS | Status: AC
  Filled 2017-09-08: qty 20

## 2017-09-08 MED ORDER — LACTATED RINGERS IV SOLN
INTRAVENOUS | Status: DC | PRN
  Administered 2017-09-08 (×2): via INTRAVENOUS

## 2017-09-08 SURGICAL SUPPLY — 78 items
BASKET BONE COLLECTION (BASKET) ×3 IMPLANT
BLADE CLIPPER SURG (BLADE) IMPLANT
BONE CHIP PRESERV 40CC PCAN1/2 (Bone Implant) ×6 IMPLANT
BUR MATCHSTICK NEURO 3.0 LAGG (BURR) ×3 IMPLANT
BUR PRECISION FLUTE 5.0 (BURR) ×3 IMPLANT
CANISTER SUCT 3000ML PPV (MISCELLANEOUS) ×3 IMPLANT
CARTRIDGE OIL MAESTRO DRILL (MISCELLANEOUS) ×1 IMPLANT
CONT SPEC 4OZ CLIKSEAL STRL BL (MISCELLANEOUS) ×3 IMPLANT
COVER BACK TABLE 60X90IN (DRAPES) ×3 IMPLANT
DECANTER SPIKE VIAL GLASS SM (MISCELLANEOUS) ×3 IMPLANT
DERMABOND ADVANCED (GAUZE/BANDAGES/DRESSINGS) ×4
DERMABOND ADVANCED .7 DNX12 (GAUZE/BANDAGES/DRESSINGS) ×2 IMPLANT
DIFFUSER DRILL AIR PNEUMATIC (MISCELLANEOUS) ×3 IMPLANT
DRAPE C-ARM 42X72 X-RAY (DRAPES) ×3 IMPLANT
DRAPE C-ARMOR (DRAPES) ×3 IMPLANT
DRAPE LAPAROTOMY 100X72X124 (DRAPES) ×3 IMPLANT
DRAPE POUCH INSTRU U-SHP 10X18 (DRAPES) ×3 IMPLANT
DRAPE SURG 17X23 STRL (DRAPES) ×3 IMPLANT
DRSG OPSITE POSTOP 4X10 (GAUZE/BANDAGES/DRESSINGS) ×3 IMPLANT
DURAPREP 26ML APPLICATOR (WOUND CARE) ×3 IMPLANT
ELECT REM PT RETURN 9FT ADLT (ELECTROSURGICAL) ×3
ELECTRODE REM PT RTRN 9FT ADLT (ELECTROSURGICAL) ×1 IMPLANT
EVACUATOR 1/8 PVC DRAIN (DRAIN) ×3 IMPLANT
GAUZE SPONGE 4X4 12PLY STRL (GAUZE/BANDAGES/DRESSINGS) ×3 IMPLANT
GAUZE SPONGE 4X4 16PLY XRAY LF (GAUZE/BANDAGES/DRESSINGS) IMPLANT
GLOVE BIO SURGEON STRL SZ7 (GLOVE) ×3 IMPLANT
GLOVE BIO SURGEON STRL SZ8 (GLOVE) ×6 IMPLANT
GLOVE BIOGEL PI IND STRL 7.5 (GLOVE) ×1 IMPLANT
GLOVE BIOGEL PI IND STRL 8 (GLOVE) IMPLANT
GLOVE BIOGEL PI IND STRL 8.5 (GLOVE) ×2 IMPLANT
GLOVE BIOGEL PI INDICATOR 7.5 (GLOVE) ×2
GLOVE BIOGEL PI INDICATOR 8 (GLOVE)
GLOVE BIOGEL PI INDICATOR 8.5 (GLOVE) ×4
GLOVE ECLIPSE 6.5 STRL STRAW (GLOVE) ×3 IMPLANT
GLOVE ECLIPSE 8.0 STRL XLNG CF (GLOVE) ×6 IMPLANT
GLOVE EXAM NITRILE LRG STRL (GLOVE) IMPLANT
GLOVE EXAM NITRILE XL STR (GLOVE) IMPLANT
GLOVE EXAM NITRILE XS STR PU (GLOVE) IMPLANT
GLOVE SS BIOGEL STRL SZ 7 (GLOVE) ×4 IMPLANT
GLOVE SUPERSENSE BIOGEL SZ 7 (GLOVE) ×8
GOWN STRL REUS W/ TWL LRG LVL3 (GOWN DISPOSABLE) ×2 IMPLANT
GOWN STRL REUS W/ TWL XL LVL3 (GOWN DISPOSABLE) ×2 IMPLANT
GOWN STRL REUS W/TWL 2XL LVL3 (GOWN DISPOSABLE) IMPLANT
GOWN STRL REUS W/TWL LRG LVL3 (GOWN DISPOSABLE) ×4
GOWN STRL REUS W/TWL XL LVL3 (GOWN DISPOSABLE) ×4
KIT BASIN OR (CUSTOM PROCEDURE TRAY) ×3 IMPLANT
KIT POSITION SURG JACKSON T1 (MISCELLANEOUS) ×3 IMPLANT
KIT ROOM TURNOVER OR (KITS) ×3 IMPLANT
MARKER SKIN DUAL TIP RULER LAB (MISCELLANEOUS) ×3 IMPLANT
MILL MEDIUM DISP (BLADE) ×3 IMPLANT
NEEDLE HYPO 21X1.5 SAFETY (NEEDLE) ×3 IMPLANT
NEEDLE HYPO 25X1 1.5 SAFETY (NEEDLE) ×3 IMPLANT
NEEDLE SPNL 18GX3.5 QUINCKE PK (NEEDLE) IMPLANT
NS IRRIG 1000ML POUR BTL (IV SOLUTION) ×3 IMPLANT
OIL CARTRIDGE MAESTRO DRILL (MISCELLANEOUS) ×3
PACK LAMINECTOMY NEURO (CUSTOM PROCEDURE TRAY) ×3 IMPLANT
PAD ARMBOARD 7.5X6 YLW CONV (MISCELLANEOUS) ×9 IMPLANT
PATTIES SURGICAL .5 X.5 (GAUZE/BANDAGES/DRESSINGS) IMPLANT
PATTIES SURGICAL .5 X1 (DISPOSABLE) IMPLANT
PATTIES SURGICAL 1X1 (DISPOSABLE) IMPLANT
ROD RELINE-O 5.5X300 STRT NS (Rod) ×2 IMPLANT
ROD RELINE-O 5.5X300MM STRT (Rod) ×4 IMPLANT
SCREW LOCK RELINE 5.5 TULIP (Screw) ×24 IMPLANT
SCREW RELINE-O POLY 5.5X45MM (Screw) ×12 IMPLANT
SCREW RELINE-O POLY 6.5X45 (Screw) ×12 IMPLANT
SPONGE LAP 4X18 X RAY DECT (DISPOSABLE) IMPLANT
SPONGE SURGIFOAM ABS GEL 100 (HEMOSTASIS) ×3 IMPLANT
STAPLER SKIN PROX WIDE 3.9 (STAPLE) IMPLANT
SUT VIC AB 1 CT1 18XBRD ANBCTR (SUTURE) ×2 IMPLANT
SUT VIC AB 1 CT1 8-18 (SUTURE) ×4
SUT VIC AB 2-0 CT1 18 (SUTURE) ×6 IMPLANT
SUT VIC AB 3-0 SH 8-18 (SUTURE) ×6 IMPLANT
SYR 20CC LL (SYRINGE) ×3 IMPLANT
SYR 5ML LL (SYRINGE) IMPLANT
TOWEL GREEN STERILE (TOWEL DISPOSABLE) ×3 IMPLANT
TOWEL GREEN STERILE FF (TOWEL DISPOSABLE) ×3 IMPLANT
TRAY FOLEY W/METER SILVER 16FR (SET/KITS/TRAYS/PACK) IMPLANT
WATER STERILE IRR 1000ML POUR (IV SOLUTION) ×3 IMPLANT

## 2017-09-08 NOTE — Progress Notes (Signed)
Pharmacy Antibiotic Note  Jennifer Chaney is a 27 y.o. female admitted on 09/07/2017 with surgical prophylaxis.  Pharmacy has been consulted for vancomycin dosing post-op due to a penicillin allergy. Per neurosurgery note on 3/2, a medium Hemovac drain was placed, so vancomycin will be started 12hrs post-op and continued.  Plan: Vancomycin 1500mg  q12hrs Monitor clinical status, length of therapy  Height: 5\' 7"  (170.2 cm) Weight: 162 lb 11.2 oz (73.8 kg) IBW/kg (Calculated) : 61.6  Temp (24hrs), Avg:98.1 F (36.7 C), Min:97.2 F (36.2 C), Max:98.5 F (36.9 C)  Recent Labs  Lab 09/07/17 1211 09/07/17 1435  WBC 14.0*  --   CREATININE  --  0.55    Estimated Creatinine Clearance: 102.7 mL/min (by C-G formula based on SCr of 0.55 mg/dL).    Allergies  Allergen Reactions  . Penicillins Other (See Comments)    Reaction unknown Has patient had a PCN reaction causing immediate rash, facial/tongue/throat swelling, SOB or lightheadedness with hypotension: unknown Has patient had a PCN reaction causing severe rash involving mucus membranes or skin necrosis: unknown Has patient had a PCN reaction that required hospitalization: yes Has patient had a PCN reaction occurring within the last 10 years: yes - when I was in labor at the hospital If all of the above answers are "NO", then may proceed with    Thank you for allowing pharmacy to be a part of this patient's care.  Nolen MuAustin J Tj Kitchings PharmD PGY1 Pharmacy Practice Resident 09/08/2017 1:52 PM Pager: [phone] Phone: [phone]

## 2017-09-08 NOTE — Transfer of Care (Signed)
Immediate Anesthesia Transfer of Care Note  Patient: Jennifer Chaney  Procedure(s) Performed: Thoracic twelve - Lumbar four pedicle screw fixation. Lumbar two laminectomy for burst fracture (N/A Back)  Patient Location: PACU  Anesthesia Type:General  Level of Consciousness: awake, oriented, sedated and patient cooperative  Airway & Oxygen Therapy: Patient Spontanous Breathing and Patient connected to nasal cannula oxygen  Post-op Assessment: Report given to RN and Post -op Vital signs reviewed and stable  Post vital signs: Reviewed and stable  Last Vitals:  Vitals:   09/08/17 0600 09/08/17 1115  BP: 117/72 (!) (P) 105/48  Pulse: 74 (P) 75  Resp: 18 (P) 19  Temp:  (!) (P) 36.2 C  SpO2: 99% (P) 100%    Last Pain:  Vitals:   09/08/17 1115  TempSrc:   PainSc: (P) Asleep      Patients Stated Pain Goal: 4 (09/08/17 0000)  Complications: No apparent anesthesia complications

## 2017-09-08 NOTE — Op Note (Signed)
09/08/2017  11:11 AM  PATIENT:  Jennifer Chaney  27 y.o. female  PRE-OPERATIVE DIAGNOSIS:  Lumbar two burst fracture with scoliosis, radiculopathy, lumbago  POST-OPERATIVE DIAGNOSIS:    Lumbar two burst fracture with scoliosis, radiculopathy, lumbago  PROCEDURE:  Procedure(s): Thoracic twelve - Lumbar four pedicle screw fixation. Lumbar two laminectomy for burst fracture (N/A) with partial vertebrectomy, correction of scoliotic deformity, posterolateral arthrodesis  SURGEON:  Surgeon(s) and Role:    Maeola Harman* Caitlin Hillmer, MD - Primary    * Coletta Memosabbell, Kyle, MD - Assisting    * Shirlean KellyNudelman, Robert, MD - Assisting  PHYSICIAN ASSISTANT:   ASSISTANTS: none   ANESTHESIA:   general  EBL:  400 mL   BLOOD ADMINISTERED:none  DRAINS: (Medium) Hemovact drain(s) in the epidural space with  Suction Open   LOCAL MEDICATIONS USED:  MARCAINE    and LIDOCAINE   SPECIMEN:  No Specimen  DISPOSITION OF SPECIMEN:  N/A  COUNTS:  YES  TOURNIQUET:  * No tourniquets in log *  DICTATION: Patient is 27 year old woman with severe scoliosis who fell from a second story window yesterday and has an L 2 burst fracture with retropulsed bone in the spinal canal and severe leg pain. It was elected to take her to surgery for decompression at L 2 and fusion at T 12  through  L 4  Levels with correction of severe scoliotic curvature.    Procedure: Patient was placed in a prone position on the [place] table after smooth and uncomplicated induction of general endotracheal anesthesia. Her low back was prepped and draped in usual sterile fashion with betadine scrub and DuraPrep after marking areas of planned incision. Area of incision was infiltrated with local lidocaine. Incision was made to the lumbodorsal fascia was incised and exposure was performed of the T 12  through L 4 spinous processes laminae facet joint and transverse processes. Intraoperative x-ray was obtained which confirmed correct orientation.  Pedicle screws  were placed at T 12 (5.5 x 45) , L 1 (5.5 x 45) , L 3 (6.5 x 45) , L 4 (6.5 x 45) levels bilaterally using AP and Lateral fluoroscopy.   A total laminectomy of L2 was performed with disarticulation of the facet joints at this level along with removal of superior articular process of L 3 bilaterally and thorough decompression was performed of both L 2, L 3  nerve roots along with the common dural tube. I tamped retropulsed bone fragments away from the thecal sac and nerve roots.  There was no evidence of violation of the dura. 5.5 mm rods were cut and affixed to the screw heads and reduction of scoliotic curvature was performed with compression on the left between L 3 and L 1 screws and distraction on the right. This improved both angulation, kyphosis and rotation.  The posterolateral region was extensively decorticated and the posterolateral region was packed with bone autograft with 80 cc cancellous bone chips. Incision was infiltrated 20 cc of long-acting Marcaine was used in the posterolateral soft tissue. A medium Hemovac drain was placed and anchored with a separate stitch.  Fascia was closed with 1 Vicryl sutures skin edges were reapproximated 2 and 3-0 Vicryl sutures. The wound is dressed with Dermabond and an occlusive dressing.   the patient was extubated in the operating room and taken to recovery in stable satisfactory condition having tolerated the operation well counts were correct at the end of the case.  PLAN OF CARE: Admit to inpatient   PATIENT DISPOSITION:  PACU - hemodynamically stable.   Delay start of Pharmacological VTE agent (>24hrs) due to surgical blood loss or risk of bleeding: yes

## 2017-09-08 NOTE — Brief Op Note (Signed)
09/08/2017  11:11 AM  PATIENT:  Jennifer Chaney  26 y.o. female  PRE-OPERATIVE DIAGNOSIS:  Lumbar two burst fracture with scoliosis, radiculopathy, lumbago  POST-OPERATIVE DIAGNOSIS:    Lumbar two burst fracture with scoliosis, radiculopathy, lumbago  PROCEDURE:  Procedure(s): Thoracic twelve - Lumbar four pedicle screw fixation. Lumbar two laminectomy for burst fracture (N/A) with partial vertebrectomy, correction of scoliotic deformity, posterolateral arthrodesis  SURGEON:  Surgeon(s) and Role:    * Said Rueb, MD - Primary    * Cabbell, Kyle, MD - Assisting    * Nudelman, Robert, MD - Assisting  PHYSICIAN ASSISTANT:   ASSISTANTS: none   ANESTHESIA:   general  EBL:  400 mL   BLOOD ADMINISTERED:none  DRAINS: (Medium) Hemovact drain(s) in the epidural space with  Suction Open   LOCAL MEDICATIONS USED:  MARCAINE    and LIDOCAINE   SPECIMEN:  No Specimen  DISPOSITION OF SPECIMEN:  N/A  COUNTS:  YES  TOURNIQUET:  * No tourniquets in log *  DICTATION: Patient is 26-year-old woman with severe scoliosis who fell from a second story window yesterday and has an L 2 burst fracture with retropulsed bone in the spinal canal and severe leg pain. It was elected to take her to surgery for decompression at L 2 and fusion at T 12  through  L 4  Levels with correction of severe scoliotic curvature.    Procedure: Patient was placed in a prone position on the Jackson table after smooth and uncomplicated induction of general endotracheal anesthesia. Her low back was prepped and draped in usual sterile fashion with betadine scrub and DuraPrep after marking areas of planned incision. Area of incision was infiltrated with local lidocaine. Incision was made to the lumbodorsal fascia was incised and exposure was performed of the T 12  through L 4 spinous processes laminae facet joint and transverse processes. Intraoperative x-ray was obtained which confirmed correct orientation.  Pedicle screws  were placed at T 12 (5.5 x 45) , L 1 (5.5 x 45) , L 3 (6.5 x 45) , L 4 (6.5 x 45) levels bilaterally using AP and Lateral fluoroscopy.   A total laminectomy of L2 was performed with disarticulation of the facet joints at this level along with removal of superior articular process of L 3 bilaterally and thorough decompression was performed of both L 2, L 3  nerve roots along with the common dural tube. I tamped retropulsed bone fragments away from the thecal sac and nerve roots.  There was no evidence of violation of the dura. 5.5 mm rods were cut and affixed to the screw heads and reduction of scoliotic curvature was performed with compression on the left between L 3 and L 1 screws and distraction on the right. This improved both angulation, kyphosis and rotation.  The posterolateral region was extensively decorticated and the posterolateral region was packed with bone autograft with 80 cc cancellous bone chips. Incision was infiltrated 20 cc of long-acting Marcaine was used in the posterolateral soft tissue. A medium Hemovac drain was placed and anchored with a separate stitch.  Fascia was closed with 1 Vicryl sutures skin edges were reapproximated 2 and 3-0 Vicryl sutures. The wound is dressed with Dermabond and an occlusive dressing.   the patient was extubated in the operating room and taken to recovery in stable satisfactory condition having tolerated the operation well counts were correct at the end of the case.  PLAN OF CARE: Admit to inpatient   PATIENT DISPOSITION:    PACU - hemodynamically stable.   Delay start of Pharmacological VTE agent (>24hrs) due to surgical blood loss or risk of bleeding: yes

## 2017-09-08 NOTE — Anesthesia Preprocedure Evaluation (Signed)
Anesthesia Evaluation  Patient identified by MRN, date of birth, ID band Patient awake    Reviewed: Allergy & Precautions, NPO status , Patient's Chart, lab work & pertinent test results  History of Anesthesia Complications Negative for: history of anesthetic complications  Airway Mallampati: II  TM Distance: >3 FB Neck ROM: Full    Dental  (+) Teeth Intact   Pulmonary neg shortness of breath, neg COPD, neg recent URI, former smoker,    breath sounds clear to auscultation       Cardiovascular negative cardio ROS   Rhythm:Regular     Neuro/Psych L2 burst fx, urinary retention requiring foley     GI/Hepatic negative GI ROS, Neg liver ROS,   Endo/Other  negative endocrine ROS  Renal/GU negative Renal ROS     Musculoskeletal negative musculoskeletal ROS (+)   Abdominal   Peds  Hematology negative hematology ROS (+)   Anesthesia Other Findings   Reproductive/Obstetrics                             Anesthesia Physical Anesthesia Plan  ASA: I  Anesthesia Plan: General   Post-op Pain Management:    Induction: Intravenous  PONV Risk Score and Plan: 3 and Ondansetron and Dexamethasone  Airway Management Planned: Oral ETT  Additional Equipment: None  Intra-op Plan:   Post-operative Plan: Extubation in OR  Informed Consent: I have reviewed the patients History and Physical, chart, labs and discussed the procedure including the risks, benefits and alternatives for the proposed anesthesia with the patient or authorized representative who has indicated his/her understanding and acceptance.   Dental advisory given  Plan Discussed with: CRNA and Surgeon  Anesthesia Plan Comments:         Anesthesia Quick Evaluation

## 2017-09-08 NOTE — Anesthesia Postprocedure Evaluation (Signed)
Anesthesia Post Note  Patient: Arletta Hegna  Procedure(s) Performed: Thoracic twelve - Lumbar four pedicle screw fixation. Lumbar two laminectomy for burst fracture (N/A Back)     Patient location during evaluation: PACU Anesthesia Type: General Level of consciousness: awake and alert Pain management: pain level controlled Vital Signs Assessment: post-procedure vital signs reviewed and stable Respiratory status: spontaneous breathing, nonlabored ventilation, respiratory function stable and patient connected to nasal cannula oxygen Cardiovascular status: blood pressure returned to baseline and stable Postop Assessment: no apparent nausea or vomiting Anesthetic complications: no    Last Vitals:  Vitals:   09/08/17 1245 09/08/17 1246  BP:  101/70  Pulse: 85 90  Resp: 14 16  Temp:  36.7 C  SpO2: 96% 94%    Last Pain:  Vitals:   09/08/17 1245  TempSrc:   PainSc: 2                  Ambera Fedele

## 2017-09-08 NOTE — Progress Notes (Signed)
Day of Surgery   Subjective/Chief Complaint: recovering in PACU. Denies abdominal pain   Objective: Vital signs in last 24 hours: Temp:  [97.2 F (36.2 C)-98.5 F (36.9 C)] 98 F (36.7 C) (03/02 1246) Pulse Rate:  [66-103] 90 (03/02 1246) Resp:  [11-25] 16 (03/02 1246) BP: (101-117)/(48-75) 101/70 (03/02 1246) SpO2:  [93 %-100 %] 94 % (03/02 1246) Weight:  [73.8 kg (162 lb 11.2 oz)] 73.8 kg (162 lb 11.2 oz) (03/01 2200) Last BM Date: (PTA)  Intake/Output from previous day: 03/01 0701 - 03/02 0700 In: 1842.5 [P.O.:240; I.V.:602.5; IV Piggyback:1000] Out: 1000 [Urine:1000] Intake/Output this shift: Total I/O In: 3000 [I.V.:2750; IV Piggyback:250] Out: 2375 [Urine:1850; Drains:125; Blood:400]  Physical exam: Somnolent but awakens to voice Unlabored respirations Abdomen soft, nontender, nondistended Extremities warm without deformity  Lab Results:  Recent Labs    09/07/17 1211  WBC 14.0*  HGB 16.3*  HCT 46.4*  PLT 287   BMET Recent Labs    09/07/17 1435  NA 136  K 4.0  CL 103  CO2 25  GLUCOSE 99  BUN 13  CREATININE 0.55  CALCIUM 8.5*   PT/INR No results for input(s): LABPROT, INR in the last 72 hours. ABG No results for input(s): PHART, HCO3 in the last 72 hours.  Invalid input(s): PCO2, PO2  Studies/Results: Dg Thoracolumabar Spine  Result Date: 09/08/2017 CLINICAL DATA:  Thoracic twelve - Lumbar four pedicle screw fixation. Lumbar two laminectomy for burst fracture EXAM: DG C-ARM 61-120 MIN; THORACOLUMBAR SPINE - 2 VIEW COMPARISON:  CT, 09/07/2017 FINDINGS: Eleven spot portable fluoro graphic images show placement of bilateral pedicle screws at T12-L1 and L3-L4. The pedicle screws appear well seated. The burst fracture of L2 than the milder fracture of T12 are again noted. IMPRESSION: Placement of pedicle screws from the lower thoracic through the lower lumbar spine spanning the fractured L2 vertebra. Orthopedic hardware appears well positioned.  Electronically Signed   By: Amie Portland M.D.   On: 09/08/2017 10:28   Ct Chest W Contrast  Result Date: 09/07/2017 CLINICAL DATA:  Jumped from second story, back, pelvic and leg pain, trauma EXAM: CT CHEST, ABDOMEN, AND PELVIS WITH CONTRAST TECHNIQUE: Multidetector CT imaging of the chest, abdomen and pelvis was performed following the standard protocol during bolus administration of intravenous contrast. CONTRAST:  ISOVUE-300 IOPAMIDOL (ISOVUE-300) INJECTION 61% COMPARISON:  03/03/2017 lumbar spine MRI FINDINGS: CT CHEST FINDINGS Cardiovascular: Intact thoracic aorta. Three-vessel appearing patent arch anatomy. Normal heart size. No pericardial fusion. No mediastinal hemorrhage or hematoma. Mediastinum/Nodes: No enlarged mediastinal, hilar, or axillary lymph nodes. Thyroid gland, trachea, and esophagus demonstrate no significant findings. Lungs/Pleura: Bilateral dependent basilar atelectasis. No focal pneumonia, collapse or consolidation. No pleural abnormality, effusion, or pneumothorax. Musculoskeletal: Mild scoliosis of the thoracic spine. No thoracic compression fracture. Sternum intact. Breast implants noted. No chest wall soft tissue asymmetry, hematoma, or chest wall emphysema. No displaced rib fracture. CT ABDOMEN PELVIS FINDINGS Hepatobiliary: No hepatic injury or perihepatic hematoma. Gallbladder is unremarkable Pancreas: Unremarkable. No pancreatic ductal dilatation or surrounding inflammatory changes. Spleen: No splenic injury or perisplenic hematoma. Adrenals/Urinary Tract: Normal adrenal glands. No acute urinary tract finding or injury. No hydroureter or obstructing ureteral calculus. Bladder unremarkable. Punctate nonobstructing left lower pole intrarenal calculus measures 4 mm, image 60 series 2. Stomach/Bowel: Negative for bowel obstruction, significant dilatation, ileus, or free air. Cecum is low lying in the midline of the pelvis. Appendix is visualized and unremarkable. No fluid  collection or abscess. Vascular/Lymphatic: No significant vascular findings are  present. No enlarged abdominal or pelvic lymph nodes. Reproductive: Uterus and bilateral adnexa are unremarkable. Other: No abdominal wall hernia or abnormality. No abdominopelvic ascites. Musculoskeletal: Scoliosis of the spine noted. Severe acute burst fracture of L2 with significant height loss, greater than 50% and retropulsion of bony fragments resulting in mild-to-moderate spinal stenosis at this level. There is also an L2 left transverse process fracture noted, image 68 series 2. Remainder of the lumbar spine and pelvis appear intact. IMPRESSION: No acute intrathoracic finding or injury. No acute intra-abdominal or pelvic finding or injury. Severe acute L2 burst fracture with retropulsion of bony fragments resulting in mild-to-moderate spinal stenosis at this level. These results were called by telephone at the time of interpretation on 09/07/2017 at 4:47 pm to Dr. Ranae PalmsYelverton, who verbally acknowledged these results. Electronically Signed   By: Judie PetitM.  Shick M.D.   On: 09/07/2017 16:47   Ct Abdomen Pelvis W Contrast  Result Date: 09/07/2017 CLINICAL DATA:  Jumped from second story, back, pelvic and leg pain, trauma EXAM: CT CHEST, ABDOMEN, AND PELVIS WITH CONTRAST TECHNIQUE: Multidetector CT imaging of the chest, abdomen and pelvis was performed following the standard protocol during bolus administration of intravenous contrast. CONTRAST:  100mL ISOVUE-300 IOPAMIDOL (ISOVUE-300) INJECTION 61% COMPARISON:  03/03/2017 lumbar spine MRI FINDINGS: CT CHEST FINDINGS Cardiovascular: Intact thoracic aorta. Three-vessel appearing patent arch anatomy. Normal heart size. No pericardial fusion. No mediastinal hemorrhage or hematoma. Mediastinum/Nodes: No enlarged mediastinal, hilar, or axillary lymph nodes. Thyroid gland, trachea, and esophagus demonstrate no significant findings. Lungs/Pleura: Bilateral dependent basilar atelectasis. No focal  pneumonia, collapse or consolidation. No pleural abnormality, effusion, or pneumothorax. Musculoskeletal: Mild scoliosis of the thoracic spine. No thoracic compression fracture. Sternum intact. Breast implants noted. No chest wall soft tissue asymmetry, hematoma, or chest wall emphysema. No displaced rib fracture. CT ABDOMEN PELVIS FINDINGS Hepatobiliary: No hepatic injury or perihepatic hematoma. Gallbladder is unremarkable Pancreas: Unremarkable. No pancreatic ductal dilatation or surrounding inflammatory changes. Spleen: No splenic injury or perisplenic hematoma. Adrenals/Urinary Tract: Normal adrenal glands. No acute urinary tract finding or injury. No hydroureter or obstructing ureteral calculus. Bladder unremarkable. Punctate nonobstructing left lower pole intrarenal calculus measures 4 mm, image 60 series 2. Stomach/Bowel: Negative for bowel obstruction, significant dilatation, ileus, or free air. Cecum is low lying in the midline of the pelvis. Appendix is visualized and unremarkable. No fluid collection or abscess. Vascular/Lymphatic: No significant vascular findings are present. No enlarged abdominal or pelvic lymph nodes. Reproductive: Uterus and bilateral adnexa are unremarkable. Other: No abdominal wall hernia or abnormality. No abdominopelvic ascites. Musculoskeletal: Scoliosis of the spine noted. Severe acute burst fracture of L2 with significant height loss, greater than 50% and retropulsion of bony fragments resulting in mild-to-moderate spinal stenosis at this level. There is also an L2 left transverse process fracture noted, image 68 series 2. Remainder of the lumbar spine and pelvis appear intact. IMPRESSION: No acute intrathoracic finding or injury. No acute intra-abdominal or pelvic finding or injury. Severe acute L2 burst fracture with retropulsion of bony fragments resulting in mild-to-moderate spinal stenosis at this level. These results were called by telephone at the time of interpretation  on 09/07/2017 at 4:47 pm to Dr. Ranae PalmsYelverton, who verbally acknowledged these results. Electronically Signed   By: Judie PetitM.  Shick M.D.   On: 09/07/2017 16:47   Dg C-arm 1-60 Min  Result Date: 09/08/2017 CLINICAL DATA:  Thoracic twelve - Lumbar four pedicle screw fixation. Lumbar two laminectomy for burst fracture EXAM: DG C-ARM 61-120 MIN; THORACOLUMBAR SPINE - 2  VIEW COMPARISON:  CT, 09/07/2017 FINDINGS: Eleven spot portable fluoro graphic images show placement of bilateral pedicle screws at T12-L1 and L3-L4. The pedicle screws appear well seated. The burst fracture of L2 than the milder fracture of T12 are again noted. IMPRESSION: Placement of pedicle screws from the lower thoracic through the lower lumbar spine spanning the fractured L2 vertebra. Orthopedic hardware appears well positioned. Electronically Signed   By: Amie Portland M.D.   On: 09/08/2017 10:28    Anti-infectives: Anti-infectives (From admission, onward)   Start     Dose/Rate Route Frequency Ordered Stop   09/08/17 0717  vancomycin (VANCOCIN) 1-5 GM/200ML-% IVPB    Comments:  Shireen Quan   : cabinet override      09/08/17 0717 09/08/17 1929      Assessment/Plan: s/p Procedure(s): Thoracic twelve - Lumbar four pedicle screw fixation. Lumbar two laminectomy for burst fracture (N/A) Trauma will sign off, please call with concerns at any time  LOS: 1 day    Berna Bue 09/08/2017

## 2017-09-08 NOTE — Progress Notes (Signed)
Call & note on MAR to pharmacy requesting IV robaxin

## 2017-09-08 NOTE — Anesthesia Procedure Notes (Signed)
Procedure Name: Intubation Date/Time: 09/08/2017 7:36 AM Performed by: Coralee RudFlores, Erline Siddoway, CRNA Pre-anesthesia Checklist: Patient identified, Emergency Drugs available, Suction available and Patient being monitored Patient Re-evaluated:Patient Re-evaluated prior to induction Oxygen Delivery Method: Circle system utilized Preoxygenation: Pre-oxygenation with 100% oxygen Induction Type: IV induction Ventilation: Mask ventilation without difficulty Laryngoscope Size: Miller and 3 Grade View: Grade I Tube type: Oral Tube size: 7.0 mm Number of attempts: 1 Airway Equipment and Method: Stylet and Bite block Placement Confirmation: ETT inserted through vocal cords under direct vision,  positive ETCO2 and breath sounds checked- equal and bilateral Secured at: 21 cm Tube secured with: Tape Dental Injury: Teeth and Oropharynx as per pre-operative assessment

## 2017-09-08 NOTE — Progress Notes (Signed)
Patient came down with nose ring, ear ring, and ring on. Patient removed items. Items picked up by Ashok CordiaMarissa, RN.

## 2017-09-09 ENCOUNTER — Inpatient Hospital Stay (HOSPITAL_COMMUNITY): Payer: Medicaid Other

## 2017-09-09 ENCOUNTER — Other Ambulatory Visit: Payer: Self-pay

## 2017-09-09 NOTE — Progress Notes (Signed)
Subjective: Patient reports feeling better  Objective: Vital signs in last 24 hours: Temp:  [97.2 F (36.2 C)-98.9 F (37.2 C)] 98.9 F (37.2 C) (03/03 0800) Pulse Rate:  [64-103] 92 (03/03 0800) Resp:  [9-26] 17 (03/03 0800) BP: (90-118)/(48-78) 118/70 (03/03 0800) SpO2:  [94 %-100 %] 99 % (03/03 0800)  Intake/Output from previous day: 03/02 0701 - 03/03 0700 In: 5260 [P.O.:360; I.V.:4150; IV Piggyback:750] Out: 5125 [Urine:4500; Drains:225; Blood:400] Intake/Output this shift: Total I/O In: 400 [I.V.:400] Out: 175 [Drains:175]  Physical Exam: Strength full both legs.  Has been up walking.  Numbness in right thigh unchanged from preop.  Dressing CDI.  Lab Results: Recent Labs    09/07/17 1211  WBC 14.0*  HGB 16.3*  HCT 46.4*  PLT 287   BMET Recent Labs    09/07/17 1435  NA 136  K 4.0  CL 103  CO2 25  GLUCOSE 99  BUN 13  CREATININE 0.55  CALCIUM 8.5*    Studies/Results: Dg Thoracolumabar Spine  Result Date: 09/08/2017 CLINICAL DATA:  Thoracic twelve - Lumbar four pedicle screw fixation. Lumbar two laminectomy for burst fracture EXAM: DG C-ARM 61-120 MIN; THORACOLUMBAR SPINE - 2 VIEW COMPARISON:  CT, 09/07/2017 FINDINGS: Eleven spot portable fluoro graphic images show placement of bilateral pedicle screws at T12-L1 and L3-L4. The pedicle screws appear well seated. The burst fracture of L2 than the milder fracture of T12 are again noted. IMPRESSION: Placement of pedicle screws from the lower thoracic through the lower lumbar spine spanning the fractured L2 vertebra. Orthopedic hardware appears well positioned. Electronically Signed   By: Amie Portland M.D.   On: 09/08/2017 10:28   Ct Chest W Contrast  Result Date: 09/07/2017 CLINICAL DATA:  Jumped from second story, back, pelvic and leg pain, trauma EXAM: CT CHEST, ABDOMEN, AND PELVIS WITH CONTRAST TECHNIQUE: Multidetector CT imaging of the chest, abdomen and pelvis was performed following the standard protocol  during bolus administration of intravenous contrast. CONTRAST:  ISOVUE-300 IOPAMIDOL (ISOVUE-300) INJECTION 61% COMPARISON:  03/03/2017 lumbar spine MRI FINDINGS: CT CHEST FINDINGS Cardiovascular: Intact thoracic aorta. Three-vessel appearing patent arch anatomy. Normal heart size. No pericardial fusion. No mediastinal hemorrhage or hematoma. Mediastinum/Nodes: No enlarged mediastinal, hilar, or axillary lymph nodes. Thyroid gland, trachea, and esophagus demonstrate no significant findings. Lungs/Pleura: Bilateral dependent basilar atelectasis. No focal pneumonia, collapse or consolidation. No pleural abnormality, effusion, or pneumothorax. Musculoskeletal: Mild scoliosis of the thoracic spine. No thoracic compression fracture. Sternum intact. Breast implants noted. No chest wall soft tissue asymmetry, hematoma, or chest wall emphysema. No displaced rib fracture. CT ABDOMEN PELVIS FINDINGS Hepatobiliary: No hepatic injury or perihepatic hematoma. Gallbladder is unremarkable Pancreas: Unremarkable. No pancreatic ductal dilatation or surrounding inflammatory changes. Spleen: No splenic injury or perisplenic hematoma. Adrenals/Urinary Tract: Normal adrenal glands. No acute urinary tract finding or injury. No hydroureter or obstructing ureteral calculus. Bladder unremarkable. Punctate nonobstructing left lower pole intrarenal calculus measures 4 mm, image 60 series 2. Stomach/Bowel: Negative for bowel obstruction, significant dilatation, ileus, or free air. Cecum is low lying in the midline of the pelvis. Appendix is visualized and unremarkable. No fluid collection or abscess. Vascular/Lymphatic: No significant vascular findings are present. No enlarged abdominal or pelvic lymph nodes. Reproductive: Uterus and bilateral adnexa are unremarkable. Other: No abdominal wall hernia or abnormality. No abdominopelvic ascites. Musculoskeletal: Scoliosis of the spine noted. Severe acute burst fracture of L2 with significant  height loss, greater than 50% and retropulsion of bony fragments resulting in mild-to-moderate spinal stenosis at this  level. There is also an L2 left transverse process fracture noted, image 68 series 2. Remainder of the lumbar spine and pelvis appear intact. IMPRESSION: No acute intrathoracic finding or injury. No acute intra-abdominal or pelvic finding or injury. Severe acute L2 burst fracture with retropulsion of bony fragments resulting in mild-to-moderate spinal stenosis at this level. These results were called by telephone at the time of interpretation on 09/07/2017 at 4:47 pm to Dr. Ranae Palms, who verbally acknowledged these results. Electronically Signed   By: Judie Petit.  Shick M.D.   On: 09/07/2017 16:47   Ct Abdomen Pelvis W Contrast  Result Date: 09/07/2017 CLINICAL DATA:  Jumped from second story, back, pelvic and leg pain, trauma EXAM: CT CHEST, ABDOMEN, AND PELVIS WITH CONTRAST TECHNIQUE: Multidetector CT imaging of the chest, abdomen and pelvis was performed following the standard protocol during bolus administration of intravenous contrast. CONTRAST:  ISOVUE-300 IOPAMIDOL (ISOVUE-300) INJECTION 61% COMPARISON:  03/03/2017 lumbar spine MRI FINDINGS: CT CHEST FINDINGS Cardiovascular: Intact thoracic aorta. Three-vessel appearing patent arch anatomy. Normal heart size. No pericardial fusion. No mediastinal hemorrhage or hematoma. Mediastinum/Nodes: No enlarged mediastinal, hilar, or axillary lymph nodes. Thyroid gland, trachea, and esophagus demonstrate no significant findings. Lungs/Pleura: Bilateral dependent basilar atelectasis. No focal pneumonia, collapse or consolidation. No pleural abnormality, effusion, or pneumothorax. Musculoskeletal: Mild scoliosis of the thoracic spine. No thoracic compression fracture. Sternum intact. Breast implants noted. No chest wall soft tissue asymmetry, hematoma, or chest wall emphysema. No displaced rib fracture. CT ABDOMEN PELVIS FINDINGS Hepatobiliary: No hepatic  injury or perihepatic hematoma. Gallbladder is unremarkable Pancreas: Unremarkable. No pancreatic ductal dilatation or surrounding inflammatory changes. Spleen: No splenic injury or perisplenic hematoma. Adrenals/Urinary Tract: Normal adrenal glands. No acute urinary tract finding or injury. No hydroureter or obstructing ureteral calculus. Bladder unremarkable. Punctate nonobstructing left lower pole intrarenal calculus measures 4 mm, image 60 series 2. Stomach/Bowel: Negative for bowel obstruction, significant dilatation, ileus, or free air. Cecum is low lying in the midline of the pelvis. Appendix is visualized and unremarkable. No fluid collection or abscess. Vascular/Lymphatic: No significant vascular findings are present. No enlarged abdominal or pelvic lymph nodes. Reproductive: Uterus and bilateral adnexa are unremarkable. Other: No abdominal wall hernia or abnormality. No abdominopelvic ascites. Musculoskeletal: Scoliosis of the spine noted. Severe acute burst fracture of L2 with significant height loss, greater than 50% and retropulsion of bony fragments resulting in mild-to-moderate spinal stenosis at this level. There is also an L2 left transverse process fracture noted, image 68 series 2. Remainder of the lumbar spine and pelvis appear intact. IMPRESSION: No acute intrathoracic finding or injury. No acute intra-abdominal or pelvic finding or injury. Severe acute L2 burst fracture with retropulsion of bony fragments resulting in mild-to-moderate spinal stenosis at this level. These results were called by telephone at the time of interpretation on 09/07/2017 at 4:47 pm to Dr. Ranae Palms, who verbally acknowledged these results. Electronically Signed   By: Judie Petit.  Shick M.D.   On: 09/07/2017 16:47   Dg C-arm 1-60 Min  Result Date: 09/08/2017 CLINICAL DATA:  Thoracic twelve - Lumbar four pedicle screw fixation. Lumbar two laminectomy for burst fracture EXAM: DG C-ARM 61-120 MIN; THORACOLUMBAR SPINE - 2 VIEW  COMPARISON:  CT, 09/07/2017 FINDINGS: Eleven spot portable fluoro graphic images show placement of bilateral pedicle screws at T12-L1 and L3-L4. The pedicle screws appear well seated. The burst fracture of L2 than the milder fracture of T12 are again noted. IMPRESSION: Placement of pedicle screws from the lower thoracic through the lower lumbar spine  spanning the fractured L2 vertebra. Orthopedic hardware appears well positioned. Electronically Signed   By: Amie Portlandavid  Ormond M.D.   On: 09/08/2017 10:28    Assessment/Plan: Transfer from ICU.  Mobilize in brace with PT.  Continue drain and Foley today.    LOS: 2 days    Dorian HeckleSTERN,Levern Pitter D, MD 09/09/2017, 9:44 AM

## 2017-09-09 NOTE — Progress Notes (Signed)
PT Cancellation Note  Patient Details Name: Jennifer Chaney MRN: [id number] DOB: 1991-07-02   Cancelled Treatment:    Reason Eval/Treat Not Completed: Pt currently on bedrest. Will await increased activity orders prior to initiating PT eval.   Marylynn PearsonLaura D Romel Dumond 09/09/2017, 7:08 AM   Conni SlipperLaura Elynn Patteson, PT, DPT Acute Rehabilitation Services Pager: [phone]

## 2017-09-09 NOTE — Evaluation (Signed)
Occupational Therapy Evaluation Patient Details Name: Jennifer Chaney MRN: [id number] DOB: 05-01-1991 Today's Date: 09/09/2017    History of Present Illness 27 year old female with history of scoliosis. Larey SeatFell from second story. Apparently she heard her husband was cheating on her. She cheated on him but then apparently tried to escape out thehouse from the second floor slipped and landed on her feet. CT scan chest abdomen pelvis shows no other maladies except for a burst fracture at L2. S/p T12-L3 pedicle screw fixation and L2 laminectomy for burst fx on 09/08/17.     Clinical Impression   PTA, pt was living with her family (including three young children) and was independent. Pt currently requiring Min A for UB ADLs, Min-Mod A for LB ADLs, and Min A for functional mobiltiy. Providing education on back precautions, brace management, donning/doffing socks and shoes, and bed mobility. Pt will need further acute OT to address LB ADLs with AE, toileting, and tub transfers. Recommend dc home once medically stable per physician.     Follow Up Recommendations  No OT follow up;Supervision/Assistance - 24 hour    Equipment Recommendations  3 in 1 bedside commode    Recommendations for Other Services PT consult     Precautions / Restrictions Precautions Precautions: Back;Fall Precaution Booklet Issued: Yes (comment) Precaution Comments: Reviewed back precautions. Pt able to recall 3/3 precautions at end of session Required Braces or Orthoses: Spinal Brace Spinal Brace: Thoracolumbosacral orthotic;Applied in sitting position Restrictions Weight Bearing Restrictions: No      Mobility Bed Mobility Overal bed mobility: Needs Assistance Bed Mobility: Rolling;Sidelying to Sit Rolling: Min guard Sidelying to sit: Min assist       General bed mobility comments: Use of bedrails to pull and roll to left. Min A for brining legs off EOB and initation sidelying to sitting.  Pt requiring Max  encouragment and anxious about opening surgical wound  Transfers Overall transfer level: Needs assistance Equipment used: None Transfers: Sit to/from Stand Sit to Stand: Min guard         General transfer comment: Min guard for safety.     Balance Overall balance assessment: Needs assistance Sitting-balance support: Feet supported;No upper extremity supported Sitting balance-Leahy Scale: Good Sitting balance - Comments: Able to bring ankles to knees for LB dressing   Standing balance support: No upper extremity supported;During functional activity Standing balance-Leahy Scale: Fair                             ADL either performed or assessed with clinical judgement   ADL Overall ADL's : Needs assistance/impaired Eating/Feeding: Set up;Sitting Eating/Feeding Details (indicate cue type and reason): Encouraged pt to eat lunch. Pt stating she hasnt been hungry all day Grooming: Set up;Supervision/safety;Standing   Upper Body Bathing: Minimal assistance;Sitting   Lower Body Bathing: Sit to/from stand;Min guard   Upper Body Dressing : Minimal assistance;Sitting Upper Body Dressing Details (indicate cue type and reason): Min A for donning brace safely and in correct position.  Lower Body Dressing: Min guard;Sit to/from stand Lower Body Dressing Details (indicate cue type and reason): Able to bring Bil ankles to knees. Donned doffed socks with increased time. Will need further education for LB dressing with reacher. Min Gaurd for safety in standing Toilet Transfer: Min guard;Ambulation(Simulated to chair)           Functional mobility during ADLs: Minimal assistance General ADL Comments: Pt performing ADLs and funcitonal mobility at Min Guard-Min A level.  Educating pt on back precautions, brace management, and LB ADLs. Will need further education on LB dressing with reacher, toileting, and tub transfer     Vision         Perception     Praxis       Pertinent Vitals/Pain Pain Assessment: Faces Faces Pain Scale: Hurts even more Pain Location: Back Pain Descriptors / Indicators: Discomfort;Grimacing Pain Intervention(s): Monitored during session;Limited activity within patient's tolerance;Repositioned     Hand Dominance     Extremity/Trunk Assessment Upper Extremity Assessment Upper Extremity Assessment: Overall WFL for tasks assessed   Lower Extremity Assessment Lower Extremity Assessment: Defer to PT evaluation   Cervical / Trunk Assessment Cervical / Trunk Assessment: Other exceptions Cervical / Trunk Exceptions: s/p back surgery   Communication Communication Communication: No difficulties   Cognition Arousal/Alertness: Lethargic Behavior During Therapy: WFL for tasks assessed/performed;Anxious Overall Cognitive Status: Within Functional Limits for tasks assessed                                 General Comments: Drowsy throughout session but able to participate and follow commands. Continues to state "I am sorry. I am just so sleepy." Upon arrival, pt anxious and stating she was unabel to breath, RN facilitate calming breathing and educated pt on anxiety.   General Comments  SpO2 in 90s on roomair throughout session. At end of session, SpO2 dropping to 83. Encouraged pt to perform purse lip breathing     Exercises     Shoulder Instructions      Home Living Family/patient expects to be discharged to:: Private residence Living Arrangements: Other relatives(Three children (ages 61, 5, and 4). ) Available Help at Discharge: Family;Friend(s)("My mother and friends can help me.") Type of Home: Other(Comment)(Duplex) Home Access: Stairs to enter Entrance Stairs-Number of Steps: 6   Home Layout: Two level;Bed/bath upstairs Alternate Level Stairs-Number of Steps: flight   Bathroom Shower/Tub: Tub/shower unit;Curtain   Firefighter: Standard     Home Equipment: None          Prior  Functioning/Environment Level of Independence: Independent        Comments: ADLs, IADLs, and runs a smoke shop        OT Problem List: Decreased strength;Decreased range of motion;Decreased activity tolerance;Impaired balance (sitting and/or standing);Decreased knowledge of use of DME or AE;Decreased knowledge of precautions;Pain      OT Treatment/Interventions: Self-care/ADL training;Therapeutic exercise;DME and/or AE instruction;Energy conservation;Therapeutic activities;Patient/family education    OT Goals(Current goals can be found in the care plan section) Acute Rehab OT Goals Patient Stated Goal: Go home to be with her children OT Goal Formulation: With patient Time For Goal Achievement: 09/23/17 Potential to Achieve Goals: Good ADL Goals Pt Will Perform Upper Body Dressing: (P) with set-up;with supervision;sitting Pt Will Perform Lower Body Dressing: (P) with set-up;with supervision;with adaptive equipment;sit to/from stand Pt Will Transfer to Toilet: (P) with set-up;with supervision;ambulating;bedside commode Pt Will Perform Toileting - Clothing Manipulation and hygiene: (P) with set-up;with supervision;sit to/from stand Pt Will Perform Tub/Shower Transfer: (P) with set-up;with supervision;Tub transfer;ambulating;3 in 1 Additional ADL Goal #1: (P) Pt will recall 3/3 back precautions independently  OT Frequency: Min 2X/week   Barriers to D/C:            Co-evaluation              AM-PAC PT "6 Clicks" Daily Activity     Outcome Measure Help from another person  eating meals?: None Help from another person taking care of personal grooming?: None Help from another person toileting, which includes using toliet, bedpan, or urinal?: A Little Help from another person bathing (including washing, rinsing, drying)?: A Little Help from another person to put on and taking off regular upper body clothing?: A Little Help from another person to put on and taking off regular  lower body clothing?: A Little 6 Click Score: 20   End of Session Equipment Utilized During Treatment: Gait belt;Back brace Nurse Communication: Mobility status;Precautions  Activity Tolerance: Patient tolerated treatment well;Patient limited by pain Patient left: in chair;with call bell/phone within reach  OT Visit Diagnosis: Unsteadiness on feet (R26.81);Other abnormalities of gait and mobility (R26.89);Muscle weakness (generalized) (M62.81);Pain Pain - part of body: (Back)                Time: 4098-1191 OT Time Calculation (min): 30 min Charges:  OT General Charges $OT Visit: 1 Visit OT Evaluation $OT Eval Moderate Complexity: 1 Mod G-Codes:     Theseus Birnie MSOT, OTR/L Acute Rehab Pager: [phone] Office: [phone]  Theodoro Grist Maranatha Grossi 09/09/2017, 12:21 PM

## 2017-09-09 NOTE — Evaluation (Signed)
Physical Therapy Evaluation Patient Details Name: Jennifer Chaney MRN: [id number] DOB: [birth year] Today's Date: 09/09/2017   History of Present Illness  Pt is a 27 year old female with history of scoliosis. Fell from second story after apparently attempting to escape out of thehouse from the second floor, slipped and landed on her feet. CT scan chest abdomen pelvis shows no other maladies except for a burst fracture at L2. S/p T12-L4 pedicle screw fixation and L2 laminectomy for burst fx on 09/08/17.   Clinical Impression  Pt admitted with above diagnosis. Pt currently with functional limitations due to the deficits listed below (see PT Problem List). At the time of PT eval pt was able to perform transfers and ambulation with gross min guard assist for balance support and safety. Pt seen in conjunction with OT, as per RN report pt unable to mobilize prior to therapy session due to dizziness with position change. Pt continues to report dizziness during gait training, however appears improved from last attempt with nursing staff. VSS throughout session however at the end O2 sats decreased into low to mid 80's. Pt will benefit from skilled PT to increase their independence and safety with mobility to allow discharge to the venue listed below.       Follow Up Recommendations No PT follow up;Supervision for mobility/OOB    Equipment Recommendations  None recommended by PT    Recommendations for Other Services       Precautions / Restrictions Precautions Precautions: Back;Fall Precaution Booklet Issued: Yes (comment) Precaution Comments: Reviewed back precautions. Pt able to recall 3/3 precautions at end of session Required Braces or Orthoses: Spinal Brace Spinal Brace: Thoracolumbosacral orthotic;Applied in sitting position Restrictions Weight Bearing Restrictions: No      Mobility  Bed Mobility Overal bed mobility: Needs Assistance Bed Mobility: Rolling;Sidelying to Sit Rolling: Min  guard Sidelying to sit: Min assist       General bed mobility comments: Use of bedrails to pull and roll to left. Min A for brining legs off EOB and initation sidelying to sitting.  Pt requiring Max encouragment and anxious about opening surgical wound  Transfers Overall transfer level: Needs assistance Equipment used: None Transfers: Sit to/from Stand Sit to Stand: Min guard         General transfer comment: Min guard for safety.   Ambulation/Gait Ambulation/Gait assistance: Min assist Ambulation Distance (Feet): 200 Feet Assistive device: Rolling walker (2 wheeled) Gait Pattern/deviations: Step-through pattern;Decreased stride length;Trunk flexed Gait velocity: Decreased Gait velocity interpretation: Below normal speed for age/gender General Gait Details: VC's for upright posture and forward gaze. Pt appears fatigued but is motivated for distance.   Stairs            Wheelchair Mobility    Modified Rankin (Stroke Patients Only)       Balance Overall balance assessment: Needs assistance Sitting-balance support: Feet supported;No upper extremity supported Sitting balance-Leahy Scale: Good Sitting balance - Comments: Able to bring ankles to knees for LB dressing   Standing balance support: No upper extremity supported;During functional activity Standing balance-Leahy Scale: Fair Standing balance comment: Holding to railings in hall for support as she fatigued.                             Pertinent Vitals/Pain Pain Assessment: Faces Faces Pain Scale: Hurts even more Pain Location: Back Pain Descriptors / Indicators: Discomfort;Grimacing Pain Intervention(s): Monitored during session;Limited activity within patient's tolerance;Repositioned    Home Living Family/patient expects  to be discharged to:: Private residence Living Arrangements: Other relatives(Three children (ages 89, 5, and 4). ) Available Help at Discharge: Family;Friend(s)("My mother  and friends can help me.") Type of Home: Other(Comment)(Duplex) Home Access: Stairs to enter   Entrance Stairs-Number of Steps: 6 Home Layout: Two level;Bed/bath upstairs Home Equipment: None      Prior Function Level of Independence: Independent         Comments: ADLs, IADLs, and runs a smoke shop     Hand Dominance        Extremity/Trunk Assessment   Upper Extremity Assessment Upper Extremity Assessment: Overall WFL for tasks assessed    Lower Extremity Assessment Lower Extremity Assessment: RLE deficits/detail RLE Deficits / Details: Pt reports numbness in RLE that has been present reportedly since surgery    Cervical / Trunk Assessment Cervical / Trunk Assessment: Other exceptions Cervical / Trunk Exceptions: s/p back surgery  Communication   Communication: No difficulties  Cognition Arousal/Alertness: Lethargic Behavior During Therapy: WFL for tasks assessed/performed;Anxious Overall Cognitive Status: Within Functional Limits for tasks assessed                                 General Comments: Drowsy throughout session but able to participate and follow commands. Continues to state "I am sorry. I am just so sleepy." Upon arrival, pt anxious and stating she was unabel to breath, RN facilitate calming breathing and educated pt on anxiety.      General Comments General comments (skin integrity, edema, etc.): SpO2 in 90s on roomair throughout session. At end of session, SpO2 dropping to 83. Encouraged pt to perform purse lip breathing     Exercises     Assessment/Plan    PT Assessment Patient needs continued PT services  PT Problem List Decreased strength;Decreased range of motion;Decreased activity tolerance;Decreased balance;Decreased mobility;Decreased knowledge of use of DME;Decreased safety awareness;Decreased knowledge of precautions;Pain       PT Treatment Interventions DME instruction;Gait training;Stair training;Functional mobility  training;Therapeutic activities;Therapeutic exercise;Neuromuscular re-education;Patient/family education    PT Goals (Current goals can be found in the Care Plan section)  Acute Rehab PT Goals Patient Stated Goal: Go home to be with her children PT Goal Formulation: With patient Time For Goal Achievement: 09/16/17 Potential to Achieve Goals: Good    Frequency Min 5X/week   Barriers to discharge        Co-evaluation PT/OT/SLP Co-Evaluation/Treatment: Yes Reason for Co-Treatment: For patient/therapist safety;To address functional/ADL transfers PT goals addressed during session: Mobility/safety with mobility;Balance         AM-PAC PT "6 Clicks" Daily Activity  Outcome Measure Difficulty turning over in bed (including adjusting bedclothes, sheets and blankets)?: A Little Difficulty moving from lying on back to sitting on the side of the bed? : A Little Difficulty sitting down on and standing up from a chair with arms (e.g., wheelchair, bedside commode, etc,.)?: A Little Help needed moving to and from a bed to chair (including a wheelchair)?: A Little Help needed walking in hospital room?: A Little Help needed climbing 3-5 steps with a railing? : A Little 6 Click Score: 18    End of Session Equipment Utilized During Treatment: Gait belt Activity Tolerance: Patient tolerated treatment well Patient left: in chair;with call bell/phone within reach Nurse Communication: Mobility status PT Visit Diagnosis: Unsteadiness on feet (R26.81);Pain;Other abnormalities of gait and mobility (R26.89) Pain - part of body: (back)    Time: 4098-1191 PT Time Calculation (  min) (ACUTE ONLY): 30 min   Charges:   PT Evaluation $PT Eval Moderate Complexity: 1 Mod     PT G Codes:        Conni Slipper, PT, DPT Acute Rehabilitation Services Pager: [phone]   Marylynn Pearson 09/09/2017, 1:04 PM

## 2017-09-10 ENCOUNTER — Encounter (HOSPITAL_COMMUNITY): Payer: Self-pay | Admitting: Neurosurgery

## 2017-09-10 LAB — CREATININE, SERUM
Creatinine, Ser: 0.48 mg/dL (ref 0.44–1.00)
GFR calc Af Amer: >60 mL/min (ref >60)
GFR calc non Af Amer: >60 mL/min (ref >60)

## 2017-09-10 MED ORDER — PANTOPRAZOLE SODIUM 40 MG PO TBEC
40.0000 mg | DELAYED_RELEASE_TABLET | Freq: Every day | ORAL | Status: DC
  Administered 2017-09-10 – 2017-09-12 (×3): 40 mg via ORAL
  Filled 2017-09-10 (×3): qty 1

## 2017-09-10 MED FILL — Thrombin For Soln 5000 Unit: CUTANEOUS | Qty: 5000 | Status: AC

## 2017-09-10 MED FILL — Thrombin For Soln 20000 Unit: CUTANEOUS | Qty: 1 | Status: AC

## 2017-09-10 NOTE — Progress Notes (Signed)
Physical Therapy Treatment Patient Details Name: Jennifer Chaney MRN: [id number] DOB: 1991/06/09 Today's Date: 09/10/2017    History of Present Illness Pt is a 27 year old female with history of scoliosis. Fell from second story after apparently attempting to escape out of thehouse from the second floor, slipped and landed on her feet. CT scan chest abdomen pelvis shows no other maladies except for a burst fracture at L2. S/p T12-L4 pedicle screw fixation and L2 laminectomy for burst fx on 09/08/17.     PT Comments    Continuing work on functional mobility and activity tolerance;  Sleepy this session, but still participating well; Reports dizziness/lightheadedness with upright activity; BP standing post-amb was 110/65;   Walked hallways without assistive device, and noted occasional reaching out for UE support; Will plan to walk with and without RW next session and update equipment recs to include RW if it proves to be helpful and useful; Plan for stair training as well   Follow Up Recommendations  No PT follow up;Supervision for mobility/OOB(The potential need for Outpatient PT can be addressed at Neurosurg/Trauma follow-up appointments. )     Equipment Recommendations  Other (comment)(will consider RW; plan to discern this next session)    Recommendations for Other Services       Precautions / Restrictions Precautions Precautions: Back;Fall Precaution Booklet Issued: Yes (comment) Precaution Comments: Reviewed back precautions. Pt able to recall 3/3 precautions at end of session Required Braces or Orthoses: Spinal Brace Spinal Brace: Thoracolumbosacral orthotic;Applied in sitting position    Mobility  Bed Mobility Overal bed mobility: Needs Assistance Bed Mobility: Rolling;Sidelying to Sit Rolling: Min guard Sidelying to sit: Min assist       General bed mobility comments: Use of bedrails to pull and roll to left. Min A for brining legs off EOB and initation sidelying to  sitting.    Transfers Overall transfer level: Needs assistance Equipment used: None Transfers: Sit to/from Stand Sit to Stand: Min guard         General transfer comment: Min guard for safety.   Ambulation/Gait Ambulation/Gait assistance: Min guard Ambulation Distance (Feet): 200 Feet Assistive device: None;1 person hand held assist(Occasional use of hallway rail) Gait Pattern/deviations: Step-through pattern;Decreased step length - right;Decreased step length - left;Decreased stride length Gait velocity: Decreased   General Gait Details: Better posture this session; walked without assistive device, slowly, but with no gross loss of balance; Occasionally using hallway rail (approx 25% of walk)   Stairs            Wheelchair Mobility    Modified Rankin (Stroke Patients Only)       Balance Overall balance assessment: Needs assistance Sitting-balance support: Feet supported;No upper extremity supported Sitting balance-Leahy Scale: Good       Standing balance-Leahy Scale: Fair(Approaching Good) Standing balance comment: Holding to railings in hall for support as she fatigued.                            Cognition Arousal/Alertness: Awake/alert(but sleepy) Behavior During Therapy: WFL for tasks assessed/performed Overall Cognitive Status: Within Functional Limits for tasks assessed                                        Exercises      General Comments General comments (skin integrity, edema, etc.): Walked on Room Air, and O2 sats stayed at acceptable  levels      Pertinent Vitals/Pain Pain Assessment: 0-10 Pain Score: 8  Pain Location: Back Pain Descriptors / Indicators: Discomfort;Grimacing Pain Intervention(s): Monitored during session    Home Living                      Prior Function            PT Goals (current goals can now be found in the care plan section) Acute Rehab PT Goals Patient Stated Goal: Go  home to be with her children PT Goal Formulation: With patient Time For Goal Achievement: 09/16/17 Potential to Achieve Goals: Good Progress towards PT goals: Progressing toward goals    Frequency    Min 5X/week      PT Plan Current plan remains appropriate    Co-evaluation              AM-PAC PT "6 Clicks" Daily Activity  Outcome Measure  Difficulty turning over in bed (including adjusting bedclothes, sheets and blankets)?: A Little Difficulty moving from lying on back to sitting on the side of the bed? : A Little Difficulty sitting down on and standing up from a chair with arms (e.g., wheelchair, bedside commode, etc,.)?: A Little Help needed moving to and from a bed to chair (including a wheelchair)?: A Little Help needed walking in hospital room?: A Little Help needed climbing 3-5 steps with a railing? : A Little 6 Click Score: 18    End of Session Equipment Utilized During Treatment: Back brace Activity Tolerance: Patient tolerated treatment well Patient left: in bed;Other (comment)(Sitting EOB, about to initiate with OT) Nurse Communication: Mobility status;Patient requests pain meds PT Visit Diagnosis: Unsteadiness on feet (R26.81);Pain;Other abnormalities of gait and mobility (R26.89) Pain - part of body: (back)     Time: [id number] PT Time Calculation (min) (ACUTE ONLY): 23 min  Charges:  $Gait Training: 23-37 mins                    G Codes:       Van ClinesHolly Magnus Crescenzo, PT  Acute Rehabilitation Services Pager [phone] Office [phone]    Levi AlandHolly H Laterrian Hevener 09/10/2017, 9:14 AM

## 2017-09-10 NOTE — Progress Notes (Addendum)
Subjective: Patient reports "I have a little numbness now and then, but he said that was normal"  Objective: Vital signs in last 24 hours: Temp:  [98.2 F (36.8 C)-98.9 F (37.2 C)] 98.5 F (36.9 C) (03/04 0300) Pulse Rate:  [85-193] 94 (03/04 0635) Resp:  [15-27] 22 (03/04 0635) BP: (107-124)/(61-85) 110/66 (03/04 0400) SpO2:  [93 %-100 %] 100 % (03/04 0635)  Intake/Output from previous day: 03/03 0701 - 03/04 0700 In: 1400 [I.V.:900; IV Piggyback:500] Out: 2585 [Urine:2225; Drains:360] Intake/Output this shift: Total I/O In: 500 [IV Piggyback:500] Out: 1020 [Urine:950; Drains:70]  Alert, conversant. Good strength BLE. Incisional pain with movement. Incision flat without erythema or drainage. Hemovac 70ml overnight.   Lab Results: Recent Labs    09/07/17 1211  WBC 14.0*  HGB 16.3*  HCT 46.4*  PLT 287   BMET Recent Labs    09/07/17 1435 09/10/17 0445  NA 136  --   K 4.0  --   CL 103  --   CO2 25  --   GLUCOSE 99  --   BUN 13  --   CREATININE 0.55 0.48  CALCIUM 8.5*  --     Studies/Results: Dg Thoracolumabar Spine  Result Date: 09/08/2017 CLINICAL DATA:  Thoracic twelve - Lumbar four pedicle screw fixation. Lumbar two laminectomy for burst fracture EXAM: DG C-ARM 61-120 MIN; THORACOLUMBAR SPINE - 2 VIEW COMPARISON:  CT, 09/07/2017 FINDINGS: Eleven spot portable fluoro graphic images show placement of bilateral pedicle screws at T12-L1 and L3-L4. The pedicle screws appear well seated. The burst fracture of L2 than the milder fracture of T12 are again noted. IMPRESSION: Placement of pedicle screws from the lower thoracic through the lower lumbar spine spanning the fractured L2 vertebra. Orthopedic hardware appears well positioned. Electronically Signed   By: Amie Portland M.D.   On: 09/08/2017 10:28   Dg C-arm 1-60 Min  Result Date: 09/08/2017 CLINICAL DATA:  Thoracic twelve - Lumbar four pedicle screw fixation. Lumbar two laminectomy for burst fracture EXAM: DG  C-ARM 61-120 MIN; THORACOLUMBAR SPINE - 2 VIEW COMPARISON:  CT, 09/07/2017 FINDINGS: Eleven spot portable fluoro graphic images show placement of bilateral pedicle screws at T12-L1 and L3-L4. The pedicle screws appear well seated. The burst fracture of L2 than the milder fracture of T12 are again noted. IMPRESSION: Placement of pedicle screws from the lower thoracic through the lower lumbar spine spanning the fractured L2 vertebra. Orthopedic hardware appears well positioned. Electronically Signed   By: Amie Portland M.D.   On: 09/08/2017 10:28   Dg Scoliosis Eval Complete Spine 2 Or 3 Views  Result Date: 09/09/2017 CLINICAL DATA:  Scoliosis, postop EXAM: DG SCOLIOSIS EVAL COMPLETE SPINE 2-3V COMPARISON:  09/07/2017 FINDINGS: Interval posterior stabilization rod and screw fixation T12 through L4. Burst fracture at L2 redemonstrated. Mild wedge compression deformity of T12. 15 degree scoliosis of the midthoracic spine, apex right. 21 degree scoliosis of the thoracolumbar spine, apex left. No congenital vertebral anomalies. Atelectasis/airspace disease at the left base with probable trace left pleural effusion IMPRESSION: 1. Interval posterior rod and fixating screws from T12 through L4 across L2 burst fracture. Mild compression deformity of T12. 2. Thoracolumbar scoliosis as above. 3. Tiny left pleural effusion with atelectasis/airspace disease at the left lung base Electronically Signed   By: Jasmine Pang M.D.   On: 09/09/2017 19:24    Assessment/Plan: Improving  LOS: 3 days  Will transition to po pain meds, continue to mobilize. Order from Dr. Venetia Maxon to pull hemovac entered.  Georgiann Cockeroteat, Brian 09/10/2017, 7:00 AM   Patient is progressing well.  Continue PT today and if doing well, discharge home in AM.

## 2017-09-10 NOTE — Progress Notes (Signed)
Occupational Therapy Treatment Patient Details Name: Jennifer Chaney MRN: [id number] DOB: [birth year] Today's Date: 09/10/2017    History of present illness Pt is a 27 year old female with history of scoliosis. Fell from second story after apparently attempting to escape out of thehouse from the second floor, slipped and landed on her feet. CT scan chest abdomen pelvis shows no other maladies except for a burst fracture at L2. S/p T12-L4 pedicle screw fixation and L2 laminectomy for burst fx on 09/08/17.    OT comments  Pt progressing towards established OT goals. Pt donning pants with Min Guard for safety in standing and demonstrating good understanding for use of AE. Educated pt on use of reacher and long handled sponge for LB ADLs. Pt doffing back brace with supervision and Min VCs. Continue to recommend dc home once medically stable per physician. Will continue to follow acutely to facilitate safe dc.    Follow Up Recommendations  No OT follow up;Supervision/Assistance - 24 hour    Equipment Recommendations  3 in 1 bedside commode    Recommendations for Other Services PT consult    Precautions / Restrictions Precautions Precautions: Back;Fall Precaution Booklet Issued: Yes (comment) Precaution Comments: Reviewed back precautions. Pt able to recall 3/3 precautions Required Braces or Orthoses: Spinal Brace Spinal Brace: Thoracolumbosacral orthotic;Applied in sitting position Restrictions Weight Bearing Restrictions: No       Mobility Bed Mobility Overal bed mobility: Needs Assistance Bed Mobility: Rolling;Sit to Sidelying Rolling: Min guard Sidelying to sit: Min assist     Sit to sidelying: Min guard General bed mobility comments: MIn Guard for safety and Min VCs for safe technique  Transfers Overall transfer level: Needs assistance Equipment used: None Transfers: Sit to/from Stand Sit to Stand: Min guard         General transfer comment: Min guard for safety.      Balance Overall balance assessment: Needs assistance Sitting-balance support: Feet supported;No upper extremity supported Sitting balance-Leahy Scale: Good Sitting balance - Comments: Able to bring ankles to knees for LB dressing   Standing balance support: No upper extremity supported;During functional activity Standing balance-Leahy Scale: Fair(Approaching Good) Standing balance comment: Holding to railings in hall for support as she fatigued.                           ADL either performed or assessed with clinical judgement   ADL Overall ADL's : Needs assistance/impaired   Eating/Feeding Details (indicate cue type and reason): Encouraging pt to eat breakfast. Pt eating 1/4 eggs and half a strip of bacon           Lower Body Bathing Details (indicate cue type and reason): Issued long handled sponge. Educated pt on use of AE for LB bathing to adhere to back precautions Upper Body Dressing : Sitting;Set up;Supervision/safety;Cueing for sequencing Upper Body Dressing Details (indicate cue type and reason): Supervision and Min VCs for safe sequencing to doff brace. Will need practicing donning brace Lower Body Dressing: Sit to/from stand;Min guard;With adaptive equipment Lower Body Dressing Details (indicate cue type and reason): Pt donning pants with reacher with Min Guard for safety in standing. Demonstrating good understanding of AE use. Issued Risk analyst: (Simulated to chair)           Functional mobility during ADLs: Min guard General ADL Comments: Pt performing LB dressing and doffing of brace with Min VCs for safe sequencing. Min GUard for safety in standing. Pt continue to report fatigue  Vision       Perception     Praxis      Cognition Arousal/Alertness: Awake/alert(but sleepy) Behavior During Therapy: WFL for tasks assessed/performed Overall Cognitive Status: Within Functional Limits for tasks assessed                                  General Comments: Pt sad at end of session and stating "I am so stupid. Why did I do this?" Providing comfort and encouragement to pt about situation and good progress with therapy. Encouraged pt to eat breakfast; pt eating 1/4 of eggs and half a strip of bacon        Exercises     Shoulder Instructions       General Comments VSS    Pertinent Vitals/ Pain       Pain Assessment: Faces Pain Score: 8  Faces Pain Scale: Hurts even more Pain Location: Back Pain Descriptors / Indicators: Discomfort;Grimacing Pain Intervention(s): Monitored during session;Limited activity within patient's tolerance;Repositioned  Home Living                                          Prior Functioning/Environment              Frequency  Min 2X/week        Progress Toward Goals  OT Goals(current goals can now be found in the care plan section)  Progress towards OT goals: Progressing toward goals  Acute Rehab OT Goals Patient Stated Goal: Go home to be with her children OT Goal Formulation: With patient Time For Goal Achievement: 09/23/17 Potential to Achieve Goals: Good ADL Goals Pt Will Perform Upper Body Dressing: with set-up;with supervision;sitting Pt Will Perform Lower Body Dressing: with set-up;with supervision;with adaptive equipment;sit to/from stand Pt Will Transfer to Toilet: with set-up;with supervision;ambulating;bedside commode Pt Will Perform Toileting - Clothing Manipulation and hygiene: with set-up;with supervision;sit to/from stand Pt Will Perform Tub/Shower Transfer: with set-up;with supervision;Tub transfer;ambulating;3 in 1 Additional ADL Goal #1: Pt will recall 3/3 back precautions independently  Plan Discharge plan remains appropriate    Co-evaluation                 AM-PAC PT "6 Clicks" Daily Activity     Outcome Measure   Help from another person eating meals?: None Help from another person taking care of personal  grooming?: None Help from another person toileting, which includes using toliet, bedpan, or urinal?: A Little Help from another person bathing (including washing, rinsing, drying)?: A Little Help from another person to put on and taking off regular upper body clothing?: A Little Help from another person to put on and taking off regular lower body clothing?: A Little 6 Click Score: 20    End of Session Equipment Utilized During Treatment: Back brace  OT Visit Diagnosis: Unsteadiness on feet (R26.81);Other abnormalities of gait and mobility (R26.89);Muscle weakness (generalized) (M62.81);Pain Pain - part of body: (Back)   Activity Tolerance Patient tolerated treatment well;Patient limited by fatigue   Patient Left with call bell/phone within reach;in bed   Nurse Communication Mobility status;Precautions        Time: [id number] OT Time Calculation (min): 17 min  Charges: OT General Charges $OT Visit: 1 Visit OT Treatments $Self Care/Home Management : 8-22 mins  Jennifer Chaney MSOT, OTR/L Acute Rehab Pager: [phone] Office:  [phone]   Jennifer Chaney 09/10/2017, 9:44 AM

## 2017-09-10 NOTE — Social Work (Addendum)
CSW acknowledging consult for behavioral health concerns, CSW left psychiatry consult number on CMA's voicemail if MD feels that is more appropriate.   Spoke with [name], RN with MD Venetia MaxonStern, he has consulted psychiatry.  CSW continuing to follow, await psychiatric recommendations.   Jennifer HutchingIsabel H Richardo Chaney, LCSWA [facility] Clinical Social Work [phone]

## 2017-09-11 DIAGNOSIS — M419 Scoliosis, unspecified: Secondary | ICD-10-CM

## 2017-09-11 DIAGNOSIS — W134XXA Fall from, out of or through window, initial encounter: Secondary | ICD-10-CM

## 2017-09-11 DIAGNOSIS — S32021A Stable burst fracture of second lumbar vertebra, initial encounter for closed fracture: Principal | ICD-10-CM

## 2017-09-11 DIAGNOSIS — Z630 Problems in relationship with spouse or partner: Secondary | ICD-10-CM

## 2017-09-11 DIAGNOSIS — Z87891 Personal history of nicotine dependence: Secondary | ICD-10-CM

## 2017-09-11 DIAGNOSIS — Z91410 Personal history of adult physical and sexual abuse: Secondary | ICD-10-CM

## 2017-09-11 DIAGNOSIS — G4700 Insomnia, unspecified: Secondary | ICD-10-CM

## 2017-09-11 DIAGNOSIS — Z008 Encounter for other general examination: Secondary | ICD-10-CM

## 2017-09-11 DIAGNOSIS — T1490XA Injury, unspecified, initial encounter: Secondary | ICD-10-CM

## 2017-09-11 LAB — BPAM RBC
BLOOD PRODUCT EXPIRATION DATE: 201904022359
Blood Product Expiration Date: 201903302359
Blood Product Expiration Date: 201904012359
Blood Product Expiration Date: 201904012359
UNIT TYPE AND RH: 7300
Unit Type and Rh: 7300
Unit Type and Rh: 7300
Unit Type and Rh: 7300

## 2017-09-11 LAB — TYPE AND SCREEN
ABO/RH(D): B POS
ANTIBODY SCREEN: NEGATIVE
UNIT DIVISION: 0
Unit division: 0
Unit division: 0
Unit division: 0
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
## 2017-09-11 NOTE — Progress Notes (Signed)
1430 Pt didn't eat any breakfast or any lunch. Stating she doesn't, "want to go to the bathroom". Encouraged pt to eat and drink post surgery. Pt educated, pt understands without assistance. Pt also asking for enema at 5pm.

## 2017-09-11 NOTE — Progress Notes (Signed)
OT Cancellation    09/11/17 1100  OT Visit Information  Last OT Received On 09/11/17  Reason Eval/Treat Not Completed Patient declined, no reason specified (Pt reporting she is in more pain today and medication is not working. Educated pt on importance of OOB activity after back surgery for pain management, edema management, and preventing weakness. Pt verbalized understanding. Requesting OT to return in after. Will return as schedule allows. Thank you.)   Curlene Dolphinharis Dellanira Dillow MSOT, OTR/L Acute Rehab Pager: [phone] Office: [phone]

## 2017-09-11 NOTE — Progress Notes (Addendum)
Subjective: Patient reports "I've walked some. my back hurts"  Objective: Vital signs in last 24 hours: Temp:  [98 F (36.7 C)-98.6 F (37 C)] 98.4 F (36.9 C) (03/05 0400) Pulse Rate:  [80-97] 85 (03/05 0400) Resp:  [11-18] 15 (03/05 0400) BP: (100-114)/(64-73) 111/70 (03/05 0400) SpO2:  [94 %-100 %] 99 % (03/05 0400)  Intake/Output from previous day: 03/04 0701 - 03/05 0700 In: 2370 [P.O.:1370; IV Piggyback:1000] Out: 1550 [Urine:1350; Drains:200] Intake/Output this shift: No intake/output data recorded.  Alert, conversant. Reports walking yesterday. Reports back pain with activity. Good strength BLE. Incision without erythema, swelling, or drainage beneath honeycomb and Dermabond. DSD at drain site, without bleeding.   Lab Results: No results for input(s): WBC, HGB, HCT, PLT in the last 72 hours. BMET Recent Labs    09/10/17 0445  CREATININE 0.48    Studies/Results: Dg Scoliosis Eval Complete Spine 2 Or 3 Views  Result Date: 09/09/2017 CLINICAL DATA:  Scoliosis, postop EXAM: DG SCOLIOSIS EVAL COMPLETE SPINE 2-3V COMPARISON:  09/07/2017 FINDINGS: Interval posterior stabilization rod and screw fixation T12 through L4. Burst fracture at L2 redemonstrated. Mild wedge compression deformity of T12. 15 degree scoliosis of the midthoracic spine, apex right. 21 degree scoliosis of the thoracolumbar spine, apex left. No congenital vertebral anomalies. Atelectasis/airspace disease at the left base with probable trace left pleural effusion IMPRESSION: 1. Interval posterior rod and fixating screws from T12 through L4 across L2 burst fracture. Mild compression deformity of T12. 2. Thoracolumbar scoliosis as above. 3. Tiny left pleural effusion with atelectasis/airspace disease at the left lung base Electronically Signed   By: Jasmine PangKim  Fujinaga M.D.   On: 09/09/2017 19:24    Assessment/Plan: Improving  LOS: 4 days  Continue to mobilize in brace. Psychiatry consult requested.    Georgiann Cockeroteat,  Brian 09/11/2017, 7:19 AM  Patient improving.  Postoperative X rays show significant correction of scoliotic curvature.  Awaiting Psychiatry consult.

## 2017-09-11 NOTE — Care Management Note (Signed)
Case Management Note  Patient Details  Name: Jennifer Chaney MRN: [id number] Date of Birth: Sep 21, 1990  Subjective/Objective:   Pt admitted on 09/07/17 with L2 burst fx, after falling/jumping from second floor.  PTA, pt independent of ADLS, has 3 children.                    Action/Plan: PT/OT recommending no OP follow up, but pt feels she may need RW and 3 in 1 for home.  Will arrange accordingly.    Expected Discharge Date:                  Expected Discharge Plan:  Home/Self Care  In-House Referral:  Clinical Social Work  Discharge planning Services  CM Consult  Post Acute Care Choice:    Choice offered to:     DME Arranged:    DME Agency:     HH Arranged:    HH Agency:     Status of Service:  In process, will continue to follow  If discussed at Long Length of Stay Meetings, dates discussed:    Additional Comments:  Quintella BatonJulie W. Ziyan Hillmer, RN, BSN  Trauma/Neuro ICU Case Manager [phone]
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
## 2017-09-11 NOTE — Progress Notes (Signed)
Occupational Therapy Treatment Patient Details Name: Jennifer Chaney MRN: [id number] DOB: [birth year] Today's Date: 09/11/2017    History of present illness Pt is a 27 year old female with history of scoliosis. Fell from second story after apparently attempting to escape out of thehouse from the second floor, slipped and landed on her feet. L2 burst fracture s/p T12-L4 pedicle screw fixation and L2 laminectomy on 09/08/17.    OT comments  Pt progressing towards established goals. Pt performing toileting and hand hygiene with supervision-Min Guard and demonstrated good adherence to back precautions. Pt doffing back brace with supervision and increased time. Will continue to follow to address donning brace and safe tub transfer. Continue to recommend dc to home once medically stable.    Follow Up Recommendations  No OT follow up;Supervision/Assistance - 24 hour    Equipment Recommendations  3 in 1 bedside commode    Recommendations for Other Services PT consult    Precautions / Restrictions Precautions Precautions: Back;Fall Precaution Comments: Reviewed back precautions. Pt able to recall 3/3 precautions Required Braces or Orthoses: Spinal Brace Spinal Brace: Thoracolumbosacral orthotic;Applied in sitting position Restrictions Weight Bearing Restrictions: No       Mobility Bed Mobility Overal bed mobility: Modified Independent Bed Mobility: Rolling;Sit to Sidelying Rolling: Modified independent (Device/Increase time) Sidelying to sit: Modified independent (Device/Increase time)     Sit to sidelying: Modified independent (Device/Increase time) General bed mobility comments: Increased time  Transfers Overall transfer level: Needs assistance Equipment used: None Transfers: Sit to/from Stand Sit to Stand: Min guard         General transfer comment: Min guard for safety.     Balance Overall balance assessment: No apparent balance deficits (not formally  assessed) Sitting-balance support: Feet supported;No upper extremity supported Sitting balance-Leahy Scale: Good     Standing balance support: No upper extremity supported;During functional activity Standing balance-Leahy Scale: Good                             ADL either performed or assessed with clinical judgement   ADL Overall ADL's : Needs assistance/impaired     Grooming: Set up;Supervision/safety;Standing;Wash/dry hands           Upper Body Dressing : Supervision/safety;Set up;Sitting Upper Body Dressing Details (indicate cue type and reason): doffing brace with supervision     Toilet Transfer: Set up;Supervision/safety;Ambulation;Regular Toilet;Grab bars   Toileting- Clothing Manipulation and Hygiene: Sit to/from stand;Cueing for sequencing;Adhering to back precautions;Min guard       Functional mobility during ADLs: Min guard General ADL Comments: Pt performing toileting and hand hygiene with supervision-Min Guard. Pt adhering to back precautions throughout session     Vision       Perception     Praxis      Cognition Arousal/Alertness: Awake/alert Behavior During Therapy: Jennifer Chaney for tasks assessed/performed Overall Cognitive Status: Within Functional Limits for tasks assessed                                 General Comments: Pt reporting she does not want to eat or drink anything because she doesnt want to go to the bathroom due to pain        Exercises     Shoulder Instructions       General Comments VSS. Best friend in room    Pertinent Vitals/ Pain       Pain Assessment: Faces Pain  Score: 3  Faces Pain Scale: Hurts even more Pain Location: Back Pain Descriptors / Indicators: Aching Pain Intervention(s): Monitored during session;Limited activity within patient's tolerance;Repositioned  Home Living                                          Prior Functioning/Environment               Frequency  Min 2X/week        Progress Toward Goals  OT Goals(current goals can now be found in the care plan section)  Progress towards OT goals: Progressing toward goals  Acute Rehab OT Goals Patient Stated Goal: Go home to be with her children OT Goal Formulation: With patient Time For Goal Achievement: 09/23/17 Potential to Achieve Goals: Good ADL Goals Pt Will Perform Upper Body Dressing: with set-up;with supervision;sitting Pt Will Perform Lower Body Dressing: with set-up;with supervision;with adaptive equipment;sit to/from stand Pt Will Transfer to Toilet: with set-up;with supervision;ambulating;bedside commode Pt Will Perform Toileting - Clothing Manipulation and hygiene: with set-up;with supervision;sit to/from stand Pt Will Perform Tub/Shower Transfer: with set-up;with supervision;Tub transfer;ambulating;3 in 1 Additional ADL Goal #1: Pt will recall 3/3 back precautions independently  Plan Discharge plan remains appropriate    Co-evaluation                 AM-PAC PT "6 Clicks" Daily Activity     Outcome Measure   Help from another person eating meals?: None Help from another person taking care of personal grooming?: None Help from another person toileting, which includes using toliet, bedpan, or urinal?: A Little Help from another person bathing (including washing, rinsing, drying)?: A Little Help from another person to put on and taking off regular upper body clothing?: A Little Help from another person to put on and taking off regular lower body clothing?: A Little 6 Click Score: 20    End of Session Equipment Utilized During Treatment: Back brace  OT Visit Diagnosis: Unsteadiness on feet (R26.81);Other abnormalities of gait and mobility (R26.89);Muscle weakness (generalized) (M62.81);Pain Pain - part of body: (Back)   Activity Tolerance Patient tolerated treatment well;Patient limited by fatigue;Patient limited by pain   Patient Left with call  bell/phone within reach;in bed;with family/visitor present   Nurse Communication Mobility status;Precautions        Time: [id number] OT Time Calculation (min): 15 min  Charges: OT General Charges $OT Visit: 1 Visit OT Treatments $Self Care/Home Management : 8-22 mins  Madix Blowe MSOT, OTR/L Acute Rehab Pager: [phone] Office: [phone]   Jennifer Chaney 09/11/2017, 3:36 PM

## 2017-09-11 NOTE — Progress Notes (Signed)
PT Cancellation Note  Patient Details Name: Jennifer Chaney MRN: [id number] DOB: July 06, 1991   Cancelled Treatment:    Reason Eval/Treat Not Completed: Pain limiting ability to participate(pt reports 10/10 pain despite premedication. Will reattempt)   Karinda Cabriales B [name] 09/11/2017, 10:21 AM  Delaney MeigsMaija Tabor Deddrick Saindon, PT [phone]

## 2017-09-12 NOTE — Progress Notes (Signed)
Occupational Therapy Treatment Patient Details Name: Jennifer Chaney MRN: [id number] DOB: [birth year] Today's Date: 09/12/2017    History of present illness Pt is a 27 year old female with history of scoliosis. Fell from second story after apparently attempting to escape out of thehouse from the second floor, slipped and landed on her feet. L2 burst fracture s/p T12-L4 pedicle screw fixation and L2 laminectomy on 09/08/17.    OT comments  Pt received awake supine in bed. Providing education on donning back brace; pt donning brace with Min A for positioning. Pt performing tub transfer with Min Guard A and 3N1. Providing education on safe technique and use of 3N1. Pt reporting significant pain in back and Bil hips; RN notified. Continue to recommend dc home once medically stable per physician. Will continue to follow acutely as admitted.    Follow Up Recommendations  No OT follow up;Supervision/Assistance - 24 hour    Equipment Recommendations  3 in 1 bedside commode    Recommendations for Other Services PT consult    Precautions / Restrictions Precautions Precautions: Back;Fall Precaution Booklet Issued: Yes (comment) Precaution Comments: Reviewed back precautions. Required Braces or Orthoses: Spinal Brace Spinal Brace: Thoracolumbosacral orthotic;Applied in sitting position Restrictions Weight Bearing Restrictions: No       Mobility Bed Mobility Overal bed mobility: Modified Independent Bed Mobility: Rolling;Sit to Sidelying;Sidelying to Sit Rolling: Modified independent (Device/Increase time) Sidelying to sit: Modified independent (Device/Increase time)     Sit to sidelying: Modified independent (Device/Increase time) General bed mobility comments: Increased time  Transfers Overall transfer level: Needs assistance Equipment used: None Transfers: Sit to/from Stand Sit to Stand: Supervision         General transfer comment: supervision for safety    Balance Overall  balance assessment: No apparent balance deficits (not formally assessed) Sitting-balance support: Feet supported;No upper extremity supported Sitting balance-Leahy Scale: Good Sitting balance - Comments: Able to bring ankles to knees for LB dressing   Standing balance support: No upper extremity supported;During functional activity Standing balance-Leahy Scale: Good Standing balance comment: Holding to railings in hall for support as she fatigued.                           ADL either performed or assessed with clinical judgement   ADL Overall ADL's : Needs assistance/impaired     Grooming: Set up;Supervision/safety;Standing;Wash/dry hands           Upper Body Dressing : Minimal assistance;Sitting Upper Body Dressing Details (indicate cue type and reason): doffing brace with supervision     Toilet Transfer: Set up;Supervision/safety;Ambulation;Regular Toilet;Grab bars   Toileting- Clothing Manipulation and Hygiene: Sit to/from stand;Cueing for sequencing;Adhering to back precautions;Min guard   Tub/ Shower Transfer: Tub transfer;Min guard;3 in 1;Ambulation   Functional mobility during ADLs: Min guard General ADL Comments: Providing pt with education on donning back brace and safe tub transfer with 3N1. Pt demosntrating understanding. reporting significant pain in back and Bil hips; RN notified     Vision       Perception     Praxis      Cognition Arousal/Alertness: Awake/alert Behavior During Therapy: WFL for tasks assessed/performed Overall Cognitive Status: Within Functional Limits for tasks assessed                                 General Comments: Pt reporting she does not want to eat or drink anything because she  doesnt want to go to the bathroom due to pain        Exercises     Shoulder Instructions       General Comments VSS    Pertinent Vitals/ Pain       Pain Assessment: Faces Faces Pain Scale: Hurts even more Pain  Location: Back Pain Descriptors / Indicators: Aching Pain Intervention(s): Monitored during session;Limited activity within patient's tolerance;Repositioned;Patient requesting pain meds-RN notified  Home Living                                          Prior Functioning/Environment              Frequency  Min 2X/week        Progress Toward Goals  OT Goals(current goals can now be found in the care plan section)  Progress towards OT goals: Progressing toward goals  Acute Rehab OT Goals Patient Stated Goal: Go home to be with her children OT Goal Formulation: With patient Time For Goal Achievement: 09/23/17 Potential to Achieve Goals: Good ADL Goals Pt Will Perform Upper Body Dressing: with set-up;with supervision;sitting Pt Will Perform Lower Body Dressing: with set-up;with supervision;with adaptive equipment;sit to/from stand Pt Will Transfer to Toilet: with set-up;with supervision;ambulating;bedside commode Pt Will Perform Toileting - Clothing Manipulation and hygiene: with set-up;with supervision;sit to/from stand Pt Will Perform Tub/Shower Transfer: with set-up;with supervision;Tub transfer;ambulating;3 in 1 Additional ADL Goal #1: Pt will recall 3/3 back precautions independently  Plan Discharge plan remains appropriate    Co-evaluation                 AM-PAC PT "6 Clicks" Daily Activity     Outcome Measure   Help from another person eating meals?: None Help from another person taking care of personal grooming?: None Help from another person toileting, which includes using toliet, bedpan, or urinal?: A Little Help from another person bathing (including washing, rinsing, drying)?: A Little Help from another person to put on and taking off regular upper body clothing?: A Little Help from another person to put on and taking off regular lower body clothing?: A Little 6 Click Score: 20    End of Session Equipment Utilized During Treatment:  Back brace  OT Visit Diagnosis: Unsteadiness on feet (R26.81);Other abnormalities of gait and mobility (R26.89);Muscle weakness (generalized) (M62.81);Pain Pain - part of body: (Back)   Activity Tolerance Patient tolerated treatment well;Patient limited by fatigue;Patient limited by pain   Patient Left with call bell/phone within reach;in bed;with family/visitor present   Nurse Communication Mobility status;Precautions        Time: 1610-9604 OT Time Calculation (min): 34 min  Charges: OT Treatments $Self Care/Home Management : 23-37 mins  Brysin Towery MSOT, OTR/L Acute Rehab Pager: [phone] Office: [phone]   Theodoro Grist Jamira Barfuss 09/12/2017, 1:32 PM

## 2017-09-12 NOTE — Progress Notes (Addendum)
Subjective: Patient reports fearful, stating "If I go home, I'll probably be back tonight. I need to be able to take care of myself""  Objective: Vital signs in last 24 hours: Temp:  [97.9 F (36.6 C)-98.8 F (37.1 C)] 98.6 F (37 C) (03/06 0400) Pulse Rate:  [88-128] 98 (03/06 0000) Resp:  [13-21] 21 (03/06 0000) BP: (101-128)/(60-78) 117/73 (03/06 0000) SpO2:  [97 %-100 %] 98 % (03/06 0000)  Intake/Output from previous day: 03/05 0701 - 03/06 0700 In: 1442 [P.O.:942; IV Piggyback:500] Out: 200 [Urine:200] Intake/Output this shift: No intake/output data recorded.  Alert, conversant. MAEW. Good strength BLE. Reports back pain with activity. Incision without erythema, swelling, or drainage beneath honeycomb and Dermabond. Pt voices concern for her safety when discussing discharge to home. She feels she is not strong enough to take care of herself, noting she will have no assistance.   Lab Results: No results for input(s): WBC, HGB, HCT, PLT in the last 72 hours. BMET Recent Labs    09/10/17 0445  CREATININE 0.48    Studies/Results: No results found.  Assessment/Plan: Improving  LOS: 5 days  Will request assistance from CSW BJ:[email or web address]:possible community resources. Discussed dc instructions with pt, including removal of drsg, showers, and return to office in 3-4 weeks for x-ray.    Georgiann Cockeroteat, Brian 09/12/2017, 7:53 AM  Awaiting SX assistance prior to discharge.

## 2017-09-12 NOTE — Progress Notes (Signed)
Physical Therapy Treatment Patient Details Name: Jennifer Chaney MRN: [id number] DOB: Nov 06, 1990 Today's Date: 09/12/2017    History of Present Illness Pt is a 27 year old female with history of scoliosis. Fell from second story after apparently attempting to escape out of thehouse from the second floor, slipped and landed on her feet. L2 burst fracture s/p T12-L4 pedicle screw fixation and L2 laminectomy on 09/08/17.     PT Comments    Pt showed the ability to donn/doff her brace and mobilize in the halls without RW and without use of rail or assist.    Follow Up Recommendations  No PT follow up;Supervision for mobility/OOB     Equipment Recommendations  None recommended by PT    Recommendations for Other Services       Precautions / Restrictions Precautions Precautions: Back;Fall Precaution Booklet Issued: Yes (comment) Precaution Comments: Reviewed back precautions. Required Braces or Orthoses: Spinal Brace Spinal Brace: Thoracolumbosacral orthotic;Applied in sitting position Restrictions Weight Bearing Restrictions: No    Mobility  Bed Mobility Overal bed mobility: Modified Independent Bed Mobility: Rolling;Sit to Sidelying;Sidelying to Sit Rolling: Modified independent (Device/Increase time) Sidelying to sit: Modified independent (Device/Increase time)     Sit to sidelying: Modified independent (Device/Increase time) General bed mobility comments: Increased time  Transfers Overall transfer level: Needs assistance Equipment used: None Transfers: Sit to/from Stand Sit to Stand: Supervision;Modified independent (Device/Increase time)            Ambulation/Gait Ambulation/Gait assistance: Supervision Ambulation Distance (Feet): 350 Feet   Gait Pattern/deviations: Step-through pattern Gait velocity: Decreased Gait velocity interpretation: Below normal speed for age/gender General Gait Details: slow gait with mild L hip drop, pt reportin hip  pain.   Stairs            Wheelchair Mobility    Modified Rankin (Stroke Patients Only)       Balance Overall balance assessment: No apparent balance deficits (not formally assessed) Sitting-balance support: Feet supported;No upper extremity supported Sitting balance-Leahy Scale: Good     Standing balance support: No upper extremity supported;During functional activity Standing balance-Leahy Scale: Good                              Cognition Arousal/Alertness: Awake/alert Behavior During Therapy: WFL for tasks assessed/performed Overall Cognitive Status: Within Functional Limits for tasks assessed                                        Exercises      General Comments General comments (skin integrity, edema, etc.): Reinforced all back care and precautions, log roll practice, bracing issues, lifting restrictions and progression of activity.      Pertinent Vitals/Pain Pain Assessment: Faces Faces Pain Scale: Hurts little more Pain Location: Back Pain Descriptors / Indicators: Aching Pain Intervention(s): Monitored during session    Home Living                      Prior Function            PT Goals (current goals can now be found in the care plan section) Acute Rehab PT Goals Patient Stated Goal: Go home to be with her children PT Goal Formulation: With patient Time For Goal Achievement: 09/16/17 Potential to Achieve Goals: Good Progress towards PT goals: Progressing toward goals    Frequency  Min 5X/week      PT Plan Current plan remains appropriate    Co-evaluation              AM-PAC PT "6 Clicks" Daily Activity  Outcome Measure  Difficulty turning over in bed (including adjusting bedclothes, sheets and blankets)?: None Difficulty moving from lying on back to sitting on the side of the bed? : None Difficulty sitting down on and standing up from a chair with arms (e.g., wheelchair, bedside  commode, etc,.)?: None Help needed moving to and from a bed to chair (including a wheelchair)?: None Help needed walking in hospital room?: A Little Help needed climbing 3-5 steps with a railing? : None 6 Click Score: 23    End of Session Equipment Utilized During Treatment: Back brace Activity Tolerance: Patient tolerated treatment well Patient left: in bed;with call bell/phone within reach Nurse Communication: Mobility status PT Visit Diagnosis: Other abnormalities of gait and mobility (R26.89) Pain - part of body: (back and hips)     Time: [id number] PT Time Calculation (min) (ACUTE ONLY): 24 min  Charges:  $Gait Training: 8-22 mins $Therapeutic Activity: 8-22 mins                    G Codes:       09/12/2017  Vaiden BingKen Alfred Eckley, PT [phone]  (pager)   Eliseo GumKenneth V Sajjad Honea 09/12/2017, 5:07 PM

## 2017-09-12 NOTE — Care Management Note (Signed)
Case Management Note  Patient Details  Name: Euleta Belson MRN: [id number] Date of Birth: 1990/11/18  Subjective/Objective:   Pt admitted on 09/07/17 with L2 burst fx, after falling/jumping from second floor.  PTA, pt independent of ADLS, has 3 children.                    Action/Plan: PT/OT recommending no OP follow up, but pt feels she may need RW and 3 in 1 for home.  Will arrange accordingly.    Expected Discharge Date:                  Expected Discharge Plan:  Home/Self Care  In-House Referral:  Clinical Social Work  Discharge planning Services  CM Consult  Post Acute Care Choice:    Choice offered to:     DME Arranged:  3-N-1, [place] rolling DME Agency:  [place]:    [place] Agency:     Status of Service:  In process, will continue to follow  If discussed at Long Length of Stay Meetings, dates discussed:    Additional Comments:  09/12/17 J. Mandisa Persinger, RN, BSN Met with pt to finalize [employer].  She states she has a friend who can assist at intervals, but otherwise will be alone.  She states she could stay with her mother, but she chooses not to do this.  PT/OT continue to recommend no OP follow up.  Pt states she will "figure it out."  Will arrange DME and notify MD of readiness for dc.     Reinaldo Raddle, RN, BSN  Trauma/Neuro ICU Case Manager [phone]

## 2017-09-12 NOTE — Social Work (Signed)
RN Case Manager has alerted CSW that she has spoken with pt regarding home support and discharge plans.   CSW signing off. Please consult if any additional needs arise.  Doy HutchingIsabel H Alaysia Lightle, LCSWA [facility] Clinical Social Work [phone]

## 2017-09-13 LAB — BASIC METABOLIC PANEL
ANION GAP: 7 (ref 5–15)
BUN: <5 mg/dL — ABNORMAL LOW (ref 6–20)
CALCIUM: 8.1 mg/dL — AB (ref 8.9–10.3)
CHLORIDE: 103 mmol/L (ref 101–111)
CO2: 26 mmol/L (ref 22–32)
Creatinine, Ser: 0.5 mg/dL (ref 0.44–1.00)
GFR calc Af Amer: >60 mL/min (ref >60)
GFR calc non Af Amer: >60 mL/min (ref >60)
GLUCOSE: 92 mg/dL (ref 65–99)
Potassium: 4 mmol/L (ref 3.5–5.1)
Sodium: 136 mmol/L (ref 135–145)

## 2017-09-13 LAB — CBC
HCT: 32.8 % — ABNORMAL LOW (ref 36.0–46.0)
HEMOGLOBIN: 10.9 g/dL — AB (ref 12.0–15.0)
MCH: 32.1 pg (ref 26.0–34.0)
MCHC: 33.2 g/dL (ref 30.0–36.0)
MCV: 96.5 fL (ref 78.0–100.0)
Platelets: 242 10*3/uL (ref 150–400)
RBC: 3.4 MIL/uL — ABNORMAL LOW (ref 3.87–5.11)
RDW: 11.5 % (ref 11.5–15.5)
WBC: 8 10*3/uL (ref 4.0–10.5)

## 2017-09-13 MED ORDER — OXYCODONE HCL 5 MG PO TABS: 5.0000 mg | ORAL_TABLET | ORAL | 0 refills | Status: DC | PRN

## 2017-09-13 MED ORDER — METHOCARBAMOL 500 MG PO TABS: 500.0000 mg | ORAL_TABLET | Freq: Four times a day (QID) | ORAL | 1 refills | Status: DC | PRN

## 2017-09-13 MED FILL — Sodium Chloride IV Soln 0.9%: INTRAVENOUS | Qty: 2000 | Status: AC

## 2017-09-13 MED FILL — Heparin Sodium (Porcine) Inj 1000 Unit/ML: INTRAMUSCULAR | Qty: 30 | Status: AC

## 2017-09-13 NOTE — Progress Notes (Addendum)
Subjective: Patient reports "I feel better today"  Objective: Vital signs in last 24 hours: Temp:  [98.2 F (36.8 C)-98.5 F (36.9 C)] 98.3 F (36.8 C) (03/07 0800) Pulse Rate:  [69-85] 69 (03/07 0800) Resp:  [13-17] 13 (03/07 0800) BP: (102-118)/(63-75) 102/63 (03/07 0800) SpO2:  [98 %-100 %] 100 % (03/07 0800) Weight:  [73.8 kg (162 lb 11.2 oz)] 73.8 kg (162 lb 11.2 oz) (03/07 0800)  Intake/Output from previous day: 03/06 0701 - 03/07 0700 In: 1093 [P.O.:840; I.V.:3; IV Piggyback:250] Out: -  Intake/Output this shift: No intake/output data recorded.  Alert, conversant. Reports only mild back pain with activity. Incision flat without erythema or drainage beneath honeycomb and Dermabond. Good strength BLE.   Lab Results: Recent Labs    09/13/17 0407  WBC 8.0  HGB 10.9*  HCT 32.8*  PLT 242   BMET Recent Labs    09/13/17 0407  NA 136  K 4.0  CL 103  CO2 26  GLUCOSE 92  BUN <5*  CREATININE 0.50  CALCIUM 8.1*    Studies/Results: No results found.  Assessment/Plan: Improving  LOS: 6 days  Per DrStern, d/c to home. Rx's for Oxycodone 5mg  and Robaxin 500mg  will be sent with pt for prn home use. Office f/u in 3-4 weeks with x-rays. Continue to use brace when oob, ok to shower and ok to remove drsg. No lifting >10lbs. Minimize bending and twisting.    Georgiann Cockeroteat, Brian 09/13/2017, 10:27 AM  Patient is doing well.  OK to discharge home.

## 2017-09-13 NOTE — Discharge Summary (Signed)
Physician Discharge Summary  Patient ID: Jennifer Chaney MRN: [id number] DOB/AGE: Jul 19, 1990 27 y.o.  Admit date: 09/07/2017 Discharge date: 09/13/2017  Admission Diagnoses: Lumbar two burst fracture with scoliosis, radiculopathy, lumbago    Discharge Diagnoses: Lumbar two burst fracture with scoliosis, radiculopathy, lumbago s/p Thoracic twelve - Lumbar four pedicle screw fixation. Lumbar two laminectomy for burst fracture (N/A) with partial vertebrectomy, correction of scoliotic deformity, posterolateral arthrodesis     Principal Problem:   Accidental injury Active Problems:   Scoliosis   L2 Lumbar burst fracture    Fall from building   Lumbar transverse process fracture (HCC)   Lumbar burst fracture, closed, initial encounter (HCC)   Closed two column fracture of lumbar vertebra [facility])   Discharged Condition: good  Hospital Course: Jennifer Chaney was admitted through the Emergency Department following a fall from 2nd story window. Resulting L2 burst fracture required decompression and stabilization. Following L2 laminectomy, partial vertebrectomy, and fusion T12-L4, she recovered well and transferred to [facility] for nursing care and therapies. She is mobilizing well.  Consults: psychiatry  Significant Diagnostic Studies: radiology: X-Ray: intra-op  Treatments: surgery: Thoracic twelve - Lumbar four pedicle screw fixation. Lumbar two laminectomy for burst fracture (N/A) with partial vertebrectomy, correction of scoliotic deformity, posterolateral arthrodesis    Discharge Exam: Blood pressure 118/72, pulse 98, temperature 98.6 F (37 C), temperature source Oral, resp. rate 14, height 5\' 7"  (1.702 m), weight 73.8 kg (162 lb 11.2 oz), last menstrual period 08/31/2017, SpO2 98 %. Alert, conversant. Reports only mild back pain with activity. Incision flat without erythema or drainage beneath honeycomb and Dermabond. Good strength BLE.   Disposition:  01-Home or Self Care  Rx's for Oxycodone 5mg  and Robaxin 500mg  will be sent with pt for prn home use. Office f/u in 3-4 weeks with x-rays. Continue to use brace when oob, ok to shower and ok to remove drsg. No lifting >10lbs. Minimize bending and twisting.       Allergies as of 09/13/2017      Reactions   Penicillins Other (See Comments)   Reaction unknown Has patient had a PCN reaction causing immediate rash, facial/tongue/throat swelling, SOB or lightheadedness with hypotension: unknown Has patient had a PCN reaction causing severe rash involving mucus membranes or skin necrosis: unknown Has patient had a PCN reaction that required hospitalization: yes Has patient had a PCN reaction occurring within the last 10 years: yes - when I was in labor at the hospital If all of the above answers are "NO", then may proceed with       Medication List    TAKE these medications   methocarbamol 500 MG tablet Commonly known as:  ROBAXIN Take 1 tablet (500 mg total) by mouth every 6 (six) hours as needed for muscle spasms.   oxyCODONE 5 MG immediate release tablet Commonly known as:  Oxy IR/ROXICODONE Take 1-2 tablets (5-10 mg total) by mouth every 3 (three) hours as needed for moderate pain or severe pain ((score 4 to 6)).            Durable Medical Equipment  (From admission, onward)        Start     Ordered   09/12/17 1341  For home use only DME Walker rolling  Once    Question:  Patient needs a walker to treat with the following condition  Answer:  Lumbar burst fracture (HCC)   09/12/17 1341   09/12/17 1339  For home use only DME Bedside commode  Once  Question:  Patient needs a bedside commode to treat with the following condition  Answer:  Lumbar burst fracture (HCC)   09/12/17 1341       Signed: Dorian HeckleSTERN,Shaleena Crusoe D, MD 09/13/2017, 11:19 AM

## 2017-09-13 NOTE — Progress Notes (Signed)
Last does of antibiotics are complete.  IV removed. Last set of vitals taken. Patient discharge information is complete, has no questions, medications are in her discharge packet. Patient is in the room waiting for transportation.

## 2017-10-27 ENCOUNTER — Other Ambulatory Visit: Payer: Self-pay

## 2017-10-27 ENCOUNTER — Emergency Department (HOSPITAL_COMMUNITY)
Admission: EM | Admit: 2017-10-27 | Discharge: 2017-10-27 | Disposition: A | Discharge status: Home / Self Care | Payer: Medicaid Other | Attending: Emergency Medicine | Admitting: Emergency Medicine

## 2017-10-27 ENCOUNTER — Encounter (HOSPITAL_COMMUNITY): Payer: Self-pay | Admitting: *Deleted

## 2017-10-27 DIAGNOSIS — M545 Low back pain: Secondary | ICD-10-CM | POA: Insufficient documentation

## 2017-10-27 DIAGNOSIS — Z5321 Procedure and treatment not carried out due to patient leaving prior to being seen by health care provider: Secondary | ICD-10-CM | POA: Insufficient documentation

## 2017-10-27 NOTE — ED Triage Notes (Signed)
Pt had lower back surgery on March 1st, she fell 2 days ago and has increased pain in lower back since, crying in triage

## 2017-10-31 NOTE — ED Notes (Signed)
10/31/2017, Attempted Follow-up call, no answer.

## 2017-11-06 ENCOUNTER — Emergency Department (HOSPITAL_COMMUNITY)
Admission: EM | Admit: 2017-11-06 | Discharge: 2017-11-06 | Disposition: A | Discharge status: Home / Self Care | Payer: Medicaid Other | Attending: Emergency Medicine | Admitting: Emergency Medicine

## 2017-11-06 ENCOUNTER — Emergency Department (HOSPITAL_COMMUNITY): Payer: Medicaid Other

## 2017-11-06 ENCOUNTER — Encounter (HOSPITAL_COMMUNITY): Payer: Self-pay | Admitting: Emergency Medicine

## 2017-11-06 ENCOUNTER — Other Ambulatory Visit: Payer: Self-pay

## 2017-11-06 DIAGNOSIS — M419 Scoliosis, unspecified: Secondary | ICD-10-CM | POA: Insufficient documentation

## 2017-11-06 DIAGNOSIS — R509 Fever, unspecified: Secondary | ICD-10-CM | POA: Diagnosis present

## 2017-11-06 DIAGNOSIS — N10 Acute tubulo-interstitial nephritis: Secondary | ICD-10-CM | POA: Insufficient documentation

## 2017-11-06 LAB — URINALYSIS, ROUTINE W REFLEX MICROSCOPIC
BILIRUBIN URINE: NEGATIVE
Glucose, UA: NEGATIVE mg/dL
Ketones, ur: 20 mg/dL — AB
Nitrite: POSITIVE — AB
PROTEIN: 100 mg/dL — AB
Specific Gravity, Urine: 1.013 (ref 1.005–1.030)
pH: 6 (ref 5.0–8.0)

## 2017-11-06 LAB — COMPREHENSIVE METABOLIC PANEL
ALT: 20 U/L (ref 14–54)
AST: 22 U/L (ref 15–41)
Albumin: 3.1 g/dL — ABNORMAL LOW (ref 3.5–5.0)
Alkaline Phosphatase: 51 U/L (ref 38–126)
Anion gap: 8 (ref 5–15)
BUN: 9 mg/dL (ref 6–20)
CO2: 25 mmol/L (ref 22–32)
Calcium: 8.5 mg/dL — ABNORMAL LOW (ref 8.9–10.3)
Chloride: 102 mmol/L (ref 101–111)
Creatinine, Ser: 0.78 mg/dL (ref 0.44–1.00)
GFR calc Af Amer: >60 mL/min (ref >60)
GFR calc non Af Amer: >60 mL/min (ref >60)
Glucose, Bld: 103 mg/dL — ABNORMAL HIGH (ref 65–99)
Potassium: 3.8 mmol/L (ref 3.5–5.1)
Sodium: 135 mmol/L (ref 135–145)
Total Bilirubin: 1.2 mg/dL (ref 0.3–1.2)
Total Protein: 7.7 g/dL (ref 6.5–8.1)

## 2017-11-06 LAB — CBC
HCT: 39.2 % (ref 36.0–46.0)
Hemoglobin: 13.5 g/dL (ref 12.0–15.0)
MCH: 32.9 pg (ref 26.0–34.0)
MCHC: 34.4 g/dL (ref 30.0–36.0)
MCV: 95.6 fL (ref 78.0–100.0)
Platelets: 225 10*3/uL (ref 150–400)
RBC: 4.1 MIL/uL (ref 3.87–5.11)
RDW: 12.5 % (ref 11.5–15.5)
WBC: 13 10*3/uL — ABNORMAL HIGH (ref 4.0–10.5)

## 2017-11-06 LAB — I-STAT BETA HCG BLOOD, ED (MC, WL, AP ONLY): HCG, QUANTITATIVE: 14.8 m[IU]/mL — AB (ref <5)

## 2017-11-06 LAB — LIPASE, BLOOD: Lipase: 26 U/L (ref 11–51)

## 2017-11-06 MED ORDER — ONDANSETRON 4 MG PO TBDP
4.0000 mg | ORAL_TABLET | Freq: Once | ORAL | Status: AC | PRN
  Administered 2017-11-06: 4 mg via ORAL
  Filled 2017-11-06: qty 1

## 2017-11-06 MED ORDER — ONDANSETRON HCL 4 MG PO TABS: 4.0000 mg | ORAL_TABLET | Freq: Four times a day (QID) | ORAL | 0 refills | Status: DC

## 2017-11-06 MED ORDER — DIPHENHYDRAMINE HCL 50 MG/ML IJ SOLN
12.5000 mg | Freq: Once | INTRAMUSCULAR | Status: AC
  Administered 2017-11-06: 12.5 mg via INTRAVENOUS
  Filled 2017-11-06: qty 1

## 2017-11-06 MED ORDER — ONDANSETRON HCL 4 MG/2ML IJ SOLN
4.0000 mg | Freq: Once | INTRAMUSCULAR | Status: AC
  Administered 2017-11-06: 4 mg via INTRAVENOUS
  Filled 2017-11-06: qty 2

## 2017-11-06 MED ORDER — ACETAMINOPHEN 325 MG PO TABS
650.0000 mg | ORAL_TABLET | Freq: Once | ORAL | Status: AC | PRN
  Administered 2017-11-06: 650 mg via ORAL
  Filled 2017-11-06: qty 2

## 2017-11-06 MED ORDER — SODIUM CHLORIDE 0.9 % IV SOLN
1.0000 g | Freq: Once | INTRAVENOUS | Status: AC
  Administered 2017-11-06: 1 g via INTRAVENOUS
  Filled 2017-11-06: qty 10

## 2017-11-06 MED ORDER — KETOROLAC TROMETHAMINE 30 MG/ML IJ SOLN
30.0000 mg | Freq: Once | INTRAMUSCULAR | Status: AC
  Administered 2017-11-06: 30 mg via INTRAVENOUS
  Filled 2017-11-06: qty 1

## 2017-11-06 MED ORDER — METOCLOPRAMIDE HCL 5 MG/ML IJ SOLN: 10.0000 mg | Freq: Once | INTRAMUSCULAR | Status: DC

## 2017-11-06 MED ORDER — SODIUM CHLORIDE 0.9 % IV BOLUS
1000.0000 mL | Freq: Once | INTRAVENOUS | Status: AC
  Administered 2017-11-06: 1000 mL via INTRAVENOUS

## 2017-11-06 MED ORDER — ACETAMINOPHEN 325 MG PO TABS
650.0000 mg | ORAL_TABLET | Freq: Once | ORAL | Status: AC
  Administered 2017-11-06: 650 mg via ORAL
  Filled 2017-11-06: qty 2

## 2017-11-06 MED ORDER — LEVOFLOXACIN 500 MG PO TABS: 500.0000 mg | ORAL_TABLET | Freq: Every day | ORAL | 0 refills | Status: DC

## 2017-11-06 NOTE — ED Triage Notes (Signed)
Patient c/o N/V, body aches, and headache x3 days. Reports decreased PO intake. Denies diarrhea and abdominal pain.

## 2017-11-06 NOTE — Discharge Instructions (Addendum)
Your evaluated in the emergency department for fever headache body aches.  Your work-up was significant for a urine infection that is probably spread to your kidneys.  We have given IV fluids antibiotics and pain medicine with good improvement.  We are sending you home to continue antibiotics starting tomorrow and you will need to keep well-hydrated and take Tylenol and ibuprofen for pain.  You should follow-up with your regular doctor and return if any worsening symptoms

## 2017-11-06 NOTE — ED Provider Notes (Signed)
[facility]-EMERGENCY DEPT Provider Note   CSN: [id number] Arrival date & time: 11/06/17  1342     History   Chief Complaint Chief Complaint  Patient presents with  . Emesis  . Generalized Body Aches    HPI Jennifer Chaney is a 27 y.o. female.  She is complaining of 3 days of fevers to T-max 103 with associated nausea vomiting headache and body aches.  There is been no cough no chest pain no abdominal pain no diarrhea.  No urinary symptoms.  No sick contacts.  No recent travel out of the country.  Last menstrual period was last week.  She states she had back surgery at the beginning of March for scoliosis.   The history is provided by the patient.  Illness  This is a new problem. The current episode started more than 2 days ago. The problem occurs constantly. The problem has not changed since onset.Associated symptoms include headaches. Pertinent negatives include no chest pain, no abdominal pain and no shortness of breath. Nothing aggravates the symptoms. Nothing relieves the symptoms. She has tried acetaminophen for the symptoms. The treatment provided no relief.    Past Medical History:  Diagnosis Date  . Preterm labor    pt-contractions with first pregnancy-del term  . Scoliosis 2014    Patient Active Problem List   Diagnosis Date Noted  . Accidental injury   . L2 Lumbar burst fracture  09/07/2017  . Fall from building 09/07/2017  . Lumbar transverse process fracture (HCC) 09/07/2017  . Lumbar burst fracture, closed, initial encounter (HCC) 09/07/2017  . Closed two column fracture of lumbar vertebra (HCC) 09/07/2017  . Contraception management 02/06/2014  . Bone and joint disorders of maternal back, pelvis, and lower limbs, antepartum(648.73) 06/24/2013  . Scoliosis 06/12/2013    Past Surgical History:  Procedure Laterality Date  . NO PAST SURGERIES    . POSTERIOR LUMBAR FUSION 4 LEVEL N/A 09/08/2017   Procedure: Thoracic twelve - Lumbar four  pedicle screw fixation. Lumbar two laminectomy for burst fracture;  Surgeon: Maeola Harman, MD;  Location: [facility] OR;  Service: Neurosurgery;  Laterality: N/A;     OB History    Gravida  4   Para  3   Term  3   Preterm      AB  1   Living  3     SAB  1   TAB      Ectopic      Multiple      Live Births  3            Home Medications    Prior to Admission medications   Medication Sig Start Date End Date Taking? Authorizing Provider  methocarbamol (ROBAXIN) 500 MG tablet Take 1 tablet (500 mg total) by mouth every 6 (six) hours as needed for muscle spasms. 09/13/17   Maeola Harman, MD  oxyCODONE (OXY IR/ROXICODONE) 5 MG immediate release tablet Take 1-2 tablets (5-10 mg total) by mouth every 3 (three) hours as needed for moderate pain or severe pain ((score 4 to 6)). 09/13/17   Maeola Harman, MD    Family History No family history on file.  Social History Social History   Tobacco Use  . Smoking status: Former Games developer  . Smokeless tobacco: Never Used  . Tobacco comment:  smokes hookah some days  Substance Use Topics  . Alcohol use: Yes    Comment: occasional   . Drug use: No     Allergies  Penicillins   Review of Systems Review of Systems  Constitutional: Positive for fever. Negative for chills.  HENT: Negative for ear pain and sore throat.   Eyes: Negative for pain and visual disturbance.  Respiratory: Negative for cough and shortness of breath.   Cardiovascular: Negative for chest pain and palpitations.  Gastrointestinal: Negative for abdominal pain and vomiting.  Genitourinary: Negative for dysuria and hematuria.  Musculoskeletal: Positive for arthralgias and myalgias. Negative for back pain.  Skin: Negative for color change and rash.  Neurological: Positive for dizziness and headaches. Negative for seizures and syncope.  All other systems reviewed and are negative.    Physical Exam Updated Vital Signs BP 105/68 (BP Location: Left Arm)   Pulse  (!) 106   Temp (!) 100.6 F (38.1 C) (Oral)   Resp 12   LMP 11/03/2017   SpO2 96%   Physical Exam  Constitutional: She appears well-developed and well-nourished. No distress.  HENT:  Head: Normocephalic and atraumatic.  Eyes: Conjunctivae are normal.  Neck: Trachea normal and full passive range of motion without pain. Neck supple. No Brudzinski's sign and no Kernig's sign noted.  Cardiovascular: Regular rhythm. Tachycardia present.  No murmur heard. Pulmonary/Chest: Effort normal and breath sounds normal. No respiratory distress.  Abdominal: Soft. There is no tenderness.  Musculoskeletal: She exhibits no edema.  Neurological: She is alert.  Skin: Skin is warm and dry.  Psychiatric: She has a normal mood and affect.  Nursing note and vitals reviewed.    ED Treatments / Results  Labs (all labs ordered are listed, but only abnormal results are displayed) Labs Reviewed  COMPREHENSIVE METABOLIC PANEL - Abnormal; Notable for the following components:      Result Value   Glucose, Bld 103 (*)    Calcium 8.5 (*)    Albumin 3.1 (*)    All other components within normal limits  CBC - Abnormal; Notable for the following components:   WBC 13.0 (*)    All other components within normal limits  URINALYSIS, ROUTINE W REFLEX MICROSCOPIC - Abnormal; Notable for the following components:   Color, Urine AMBER (*)    APPearance CLOUDY (*)    Hgb urine dipstick MODERATE (*)    Ketones, ur 20 (*)    Protein, ur 100 (*)    Nitrite POSITIVE (*)    Leukocytes, UA LARGE (*)    WBC, UA >50 (*)    Bacteria, UA MANY (*)    Non Squamous Epithelial 0-5 (*)    All other components within normal limits  I-STAT BETA HCG BLOOD, ED ([facility], [facility], [facility] ONLY) - Abnormal; Notable for the following components:   I-stat hCG, quantitative 14.8 (*)    All other components within normal limits  LIPASE, BLOOD    EKG None  Radiology Dg Chest 2 View  Result Date: 11/06/2017 CLINICAL DATA:  Fever EXAM: CHEST -  2 VIEW COMPARISON:  09/07/2017 FINDINGS: Normal heart size. Lungs clear. No pneumothorax. No pleural effusion. Lumbar fusion hardware has been placed. Stable T12 compression deformity. IMPRESSION: No active cardiopulmonary disease. Electronically Signed   By: Jolaine Click M.D.   On: 11/06/2017 19:33    Procedures Procedures (including critical care time)  Medications Ordered in ED Medications  sodium chloride 0.9 % bolus 1,000 mL (has no administration in time range)  ondansetron (ZOFRAN) injection 4 mg (has no administration in time range)  ondansetron (ZOFRAN-ODT) disintegrating tablet 4 mg (4 mg Oral Given 11/06/17 1400)  acetaminophen (TYLENOL) tablet 650 mg (  650 mg Oral Given 11/06/17 1359)     Initial Impression / Assessment and Plan / ED Course  I have reviewed the triage vital signs and the nursing notes.  Pertinent labs & imaging results that were available during my care of the patient were reviewed by me and considered in my medical decision making (see chart for details).  Clinical Course as of Nov 08 1718  Tue Nov 06, 2017  2031 To the patient on the results of her lab work that she has a UTI likely some Pilo.  She still complaining of headache and body aches.  Toradol and fluids are continued.  Antibiotics ordered.  Nurse just informed me know how her temp is spiked again so we will order some p.o. Tylenol   [MB]    Clinical Course User Index [MB] Terrilee Files, MD     Final Clinical Impressions(s) / ED Diagnoses   Final diagnoses:  Pyelonephritis, acute    ED Discharge Orders        Ordered    levofloxacin (LEVAQUIN) 500 MG tablet  Daily     11/06/17 2211    ondansetron (ZOFRAN) 4 MG tablet  Every 6 hours     11/06/17 2211       Terrilee Files, MD 11/07/17 1724

## 2017-11-06 NOTE — ED Notes (Signed)
IV team in room  

## 2017-11-06 NOTE — ED Notes (Signed)
Patient vomiting during time of blood draw.

## 2018-06-13 ENCOUNTER — Emergency Department (HOSPITAL_COMMUNITY)
Admission: EM | Admit: 2018-06-13 | Discharge: 2018-06-14 | Disposition: A | Discharge status: Other Acute Inpatient Hospital | Payer: Medicaid Other | Attending: Emergency Medicine | Admitting: Emergency Medicine

## 2018-06-13 DIAGNOSIS — T50902A Poisoning by unspecified drugs, medicaments and biological substances, intentional self-harm, initial encounter: Secondary | ICD-10-CM | POA: Diagnosis present

## 2018-06-13 DIAGNOSIS — T450X5A Adverse effect of antiallergic and antiemetic drugs, initial encounter: Secondary | ICD-10-CM | POA: Diagnosis not present

## 2018-06-13 DIAGNOSIS — F332 Major depressive disorder, recurrent severe without psychotic features: Secondary | ICD-10-CM | POA: Insufficient documentation

## 2018-06-13 DIAGNOSIS — Z87891 Personal history of nicotine dependence: Secondary | ICD-10-CM | POA: Diagnosis not present

## 2018-06-13 DIAGNOSIS — R000 Tachycardia, unspecified: Secondary | ICD-10-CM | POA: Insufficient documentation

## 2018-06-13 DIAGNOSIS — T450X2A Poisoning by antiallergic and antiemetic drugs, intentional self-harm, initial encounter: Secondary | ICD-10-CM

## 2018-06-13 LAB — COMPREHENSIVE METABOLIC PANEL
ALT: 14 U/L (ref 0–44)
AST: 19 U/L (ref 15–41)
Albumin: 4.9 g/dL (ref 3.5–5.0)
Alkaline Phosphatase: 41 U/L (ref 38–126)
Anion gap: 13 (ref 5–15)
BUN: 14 mg/dL (ref 6–20)
CO2: 22 mmol/L (ref 22–32)
Calcium: 9.6 mg/dL (ref 8.9–10.3)
Chloride: 106 mmol/L (ref 98–111)
Creatinine, Ser: 0.73 mg/dL (ref 0.44–1.00)
GFR calc Af Amer: >60 mL/min (ref >60)
GFR calc non Af Amer: >60 mL/min (ref >60)
Glucose, Bld: 70 mg/dL (ref 70–99)
POTASSIUM: 3.6 mmol/L (ref 3.5–5.1)
Sodium: 141 mmol/L (ref 135–145)
Total Bilirubin: 1.2 mg/dL (ref 0.3–1.2)
Total Protein: 8.4 g/dL — ABNORMAL HIGH (ref 6.5–8.1)

## 2018-06-13 LAB — CBC WITH DIFFERENTIAL/PLATELET
Abs Immature Granulocytes: 0.04 10*3/uL (ref 0.00–0.07)
Basophils Absolute: 0.1 10*3/uL (ref 0.0–0.1)
Basophils Relative: 1 %
Eosinophils Absolute: 0 10*3/uL (ref 0.0–0.5)
Eosinophils Relative: 0 %
HCT: 45.7 % (ref 36.0–46.0)
Hemoglobin: 14.9 g/dL (ref 12.0–15.0)
Immature Granulocytes: 0 %
Lymphocytes Relative: 11 %
Lymphs Abs: 1 10*3/uL (ref 0.7–4.0)
MCH: 31 pg (ref 26.0–34.0)
MCHC: 32.6 g/dL (ref 30.0–36.0)
MCV: 95.2 fL (ref 80.0–100.0)
MONOS PCT: 6 %
Monocytes Absolute: 0.5 10*3/uL (ref 0.1–1.0)
Neutro Abs: 7.5 10*3/uL (ref 1.7–7.7)
Neutrophils Relative %: 82 %
Platelets: 277 10*3/uL (ref 150–400)
RBC: 4.8 MIL/uL (ref 3.87–5.11)
RDW: 13.2 % (ref 11.5–15.5)
WBC: 9.1 10*3/uL (ref 4.0–10.5)
nRBC: 0 % (ref 0.0–0.2)

## 2018-06-13 LAB — I-STAT BETA HCG BLOOD, ED (MC, WL, AP ONLY)

## 2018-06-13 LAB — SALICYLATE LEVEL: Salicylate Lvl: <7 mg/dL (ref 2.8–30.0)

## 2018-06-13 LAB — ACETAMINOPHEN LEVEL: Acetaminophen (Tylenol), Serum: <10 ug/mL — ABNORMAL LOW (ref 10–30)

## 2018-06-13 LAB — ETHANOL: Alcohol, Ethyl (B): <10 mg/dL (ref <10)

## 2018-06-13 MED ORDER — LACTATED RINGERS IV BOLUS
1000.0000 mL | Freq: Once | INTRAVENOUS | Status: AC
  Administered 2018-06-13: 1000 mL via INTRAVENOUS

## 2018-06-13 MED ORDER — CHARCOAL ACTIVATED PO LIQD
50.0000 g | Freq: Once | ORAL | Status: AC
  Administered 2018-06-13: 50 g via ORAL
  Filled 2018-06-13: qty 240

## 2018-06-13 MED ORDER — LACTATED RINGERS IV SOLN
INTRAVENOUS | Status: DC
  Administered 2018-06-13: 20:00:00 via INTRAVENOUS

## 2018-06-13 NOTE — ED Triage Notes (Signed)
Patient took diphenhydramine in attempts to harm her self. EMS reports patient stated she took most 25mg  pills out of a new bottle ( approximately 99 pills). EMS reports patient was alert and oriented while a little lethargic upon arrival. She complains of numbness in the legs and shortness of breath.

## 2018-06-13 NOTE — ED Notes (Signed)
Poison control contacted. They recommend IV Fluids, Ativan/ Valium, to monitor urine output, an EKG and to monitor the patient for at least 6 hours post ingestion.

## 2018-06-13 NOTE — ED Provider Notes (Addendum)
[facility]-EMERGENCY DEPT Provider Note   CSN: [id number] Arrival date & time: 06/13/18  1858     History   Chief Complaint Chief Complaint  Patient presents with  . Drug Overdose    HPI Jennifer Chaney is a 27 y.o. female.  HPI Patient reports she is a failure and she cannot even take care of her own children.  She reports she has 3 children.  She has felt overwhelmed.  I got additional history from police.  Patient had reportedly called her husband from a motel and stated some suicidal intention around overdosing on medications.  They went to the address and tried to find her but initially were unable to locate the patient.  Within about 2 hours they found her in a car to [employer].  She had a bottle of diphenhydramine.  They report there were multiple pills of it scattered in the car.  They are not sure how much she took.  Patient reports she took "a lot".  She reported 25 or so tablets.  The bottle is diphenhydramine without any acetaminophen.  Police did not find other medication bottles in the car.  Patient has not had concomitant alcohol use.  She however reports that she does drink about 2 bottles of wine on an almost daily basis.  Patient denies any pain.  She has not had vomiting.  She reports she just feels sleepy and "terrible".  She reports she feels like an "failure" because she could not even successfully kill her self.  Patient denies she has any active medical problems. Past Medical History:  Diagnosis Date  . Preterm labor    pt-contractions with first pregnancy-del term  . Scoliosis 2014    Patient Active Problem List   Diagnosis Date Noted  . Accidental injury   . L2 Lumbar burst fracture  09/07/2017  . Fall from building 09/07/2017  . Lumbar transverse process fracture (HCC) 09/07/2017  . Lumbar burst fracture, closed, initial encounter (HCC) 09/07/2017  . Closed two column fracture of lumbar vertebra (HCC) 09/07/2017  . Contraception  management 02/06/2014  . Bone and joint disorders of maternal back, pelvis, and lower limbs, antepartum(648.73) 06/24/2013  . Scoliosis 06/12/2013    Past Surgical History:  Procedure Laterality Date  . NO PAST SURGERIES    . POSTERIOR LUMBAR FUSION 4 LEVEL N/A 09/08/2017   Procedure: Thoracic twelve - Lumbar four pedicle screw fixation. Lumbar two laminectomy for burst fracture;  Surgeon: Maeola Harman, MD;  Location: [facility] OR;  Service: Neurosurgery;  Laterality: N/A;     OB History    Gravida  4   Para  3   Term  3   Preterm      AB  1   Living  3     SAB  1   TAB      Ectopic      Multiple      Live Births  3            Home Medications    Prior to Admission medications   Medication Sig Start Date End Date Taking? Authorizing Provider  acetaminophen (TYLENOL) 500 MG tablet Take 500 mg by mouth every 6 (six) hours as needed for mild pain.   Yes [provider]  ibuprofen (ADVIL,MOTRIN) 200 MG tablet Take 200 mg by mouth every 6 (six) hours as needed for mild pain.   Yes [provider]  levofloxacin (LEVAQUIN) 500 MG tablet Take 1 tablet (500 mg total)  by mouth daily. Patient not taking: Reported on 06/13/2018 11/06/17   Terrilee FilesButler, Michael C, MD  methocarbamol (ROBAXIN) 500 MG tablet Take 1 tablet (500 mg total) by mouth every 6 (six) hours as needed for muscle spasms. Patient not taking: Reported on 11/06/2017 09/13/17   Maeola HarmanStern, Joseph, MD  ondansetron (ZOFRAN) 4 MG tablet Take 1 tablet (4 mg total) by mouth every 6 (six) hours. Patient not taking: Reported on 06/13/2018 11/06/17   Terrilee FilesButler, Michael C, MD  oxyCODONE (OXY IR/ROXICODONE) 5 MG immediate release tablet Take 1-2 tablets (5-10 mg total) by mouth every 3 (three) hours as needed for moderate pain or severe pain ((score 4 to 6)). Patient not taking: Reported on 11/06/2017 09/13/17   Maeola HarmanStern, Joseph, MD    Family History No family history on file.  Social History Social History   Tobacco Use  .  Smoking status: Former Games developermoker  . Smokeless tobacco: Never Used  . Tobacco comment:  smokes hookah some days  Substance Use Topics  . Alcohol use: Yes    Comment: occasional   . Drug use: No     Allergies   Penicillins   Review of Systems Review of Systems 10 Systems reviewed and are negative for acute change except as noted in the HPI.  Physical Exam Updated Vital Signs BP 111/74   Pulse 87   Temp (!) 97.4 F (36.3 C) (Oral)   Resp (!) 24   LMP 04/13/2018   SpO2 100%   Physical Exam  Constitutional: She is oriented to person, place, and time.  Patient is alert and appropriate.  She is closing her eyes a lot but shows no signs of confusion.  She is tearful.  No respiratory distress.  HENT:  Head: Normocephalic and atraumatic.  Nose: Nose normal.  Mouth/Throat: Oropharynx is clear and moist.  Eyes: Pupils are equal, round, and reactive to light. EOM are normal.  Neck: Neck supple.  Cardiovascular:  Tachycardia no rub murmur gallop.  Pulmonary/Chest: Effort normal and breath sounds normal.  Abdominal: Soft. She exhibits no distension. There is no tenderness. There is no guarding.  Musculoskeletal: Normal range of motion. She exhibits no edema, tenderness or deformity.  Neurological: She is alert and oriented to person, place, and time. No cranial nerve deficit. She exhibits normal muscle tone. Coordination normal.  Skin: Skin is warm and dry.  Psychiatric:  Patient is tearful.  She is tremulous.  Poor eye contact.     ED Treatments / Results  Labs (all labs ordered are listed, but only abnormal results are displayed) Labs Reviewed  COMPREHENSIVE METABOLIC PANEL - Abnormal; Notable for the following components:      Result Value   Total Protein 8.4 (*)    All other components within normal limits  ACETAMINOPHEN LEVEL - Abnormal; Notable for the following components:   Acetaminophen (Tylenol), Serum <10 (*)    All other components within normal limits  ETHANOL    SALICYLATE LEVEL  CBC WITH DIFFERENTIAL/PLATELET  RAPID URINE DRUG SCREEN, HOSP PERFORMED  I-STAT BETA HCG BLOOD, ED ([facility], [facility], [facility] ONLY)    EKG EKG Interpretation  Date/Time:  Thursday June 13 2018 19:13:06 EST Ventricular Rate:  99 PR Interval:    QRS Duration: 78 QT Interval:  342 QTC Calculation: 439 R Axis:   94 Text Interpretation:  Sinus rhythm Borderline right axis deviation Borderline T wave abnormalities no change frompreviosu Confirmed by Arby BarrettePfeiffer, Arsema Tusing [phone]) on 06/13/2018 7:40:46 PM   Radiology No results found.  Procedures  Procedures (including critical care time) CRITICAL CARE Performed by: Arby Barrette   Total critical care time: 45 minutes  Critical care time was exclusive of separately billable procedures and treating other patients.  Critical care was necessary to treat or prevent imminent or life-threatening deterioration.  Critical care was time spent personally by me on the following activities: development of treatment plan with patient and/or surrogate as well as nursing, discussions with consultants, evaluation of patient's response to treatment, examination of patient, obtaining history from patient or surrogate, ordering and performing treatments and interventions, ordering and review of laboratory studies, ordering and review of radiographic studies, pulse oximetry and re-evaluation of patient's condition. Medications Ordered in ED Medications  lactated ringers infusion ( Intravenous Stopped 06/13/18 2329)  LORazepam (ATIVAN) injection 0-4 mg (has no administration in time range)    Or  LORazepam (ATIVAN) tablet 0-4 mg (has no administration in time range)  LORazepam (ATIVAN) injection 0-4 mg (has no administration in time range)    Or  LORazepam (ATIVAN) tablet 0-4 mg (has no administration in time range)  thiamine (VITAMIN B-1) tablet 100 mg (has no administration in time range)    Or  thiamine (B-1) injection 100 mg (has no  administration in time range)  ibuprofen (ADVIL,MOTRIN) tablet 600 mg (has no administration in time range)  ondansetron (ZOFRAN) tablet 4 mg (has no administration in time range)  charcoal activated (NO SORBITOL) (ACTIDOSE-AQUA) suspension 50 g (50 g Oral Given 06/13/18 1949)  lactated ringers bolus 1,000 mL (0 mLs Intravenous Stopped 06/13/18 2329)     Initial Impression / Assessment and Plan / ED Course  I have reviewed the triage vital signs and the nursing notes.  Pertinent labs & imaging results that were available during my care of the patient were reviewed by me and considered in my medical decision making (see chart for details).  Clinical Course as of Jun 14 4  Thu Jun 13, 2018  2153 Patient is sleeping comfortably.  Vital signs are stable.  No respiratory distress.  Color is good.   [MP]  Fri Jun 14, 2018  0002 Patient is alert and well in appearance.  She is texting on her phone.  No distress.  Vital signs are stable.  Patient reports that she wants to go home.  She is counseled that due to intentional overdose she will have to stay in the hospital until complete psychiatric evaluation.  Patient is medically cleared at this time.   [MP]    Clinical Course User Index [MP] Arby Barrette, MD   Patient presents with overdose of diphenhydramine.  Uncertain how many tablets.  At this time, it does not appear to be polysubstance overdose.  Patient will continue to be monitored.  Will give supportive care.  If patient remains stable after 6 to 8 hours of monitoring, will plan for psychiatric evaluation and admission.   Patient is medically clear.  She is alert and appropriate.  No signs of distress.  Vital signs are stable.  Patient is admitted in IVC conditions.  Her husband has taken out IVC paperwork.  I agree with this based on patient's intentional overdose and report of not wanting to live anymore.  At this time, patient reports she wants to leave and has to take her children  to school in the morning.  She is advised that it is not possible for her to leave until complete psychiatric evaluation.  Final Clinical Impressions(s) / ED Diagnoses   Final diagnoses:  Intentional drug  overdose, initial encounter [facility])  Intentional diphenhydramine overdose, initial encounter [facility])  Severe episode of recurrent major depressive disorder, without psychotic features [facility])    ED Discharge Orders    None       Arby Barrette, MD 06/13/18 2134    Arby Barrette, MD 06/14/18 0006

## 2018-06-13 NOTE — ED Notes (Signed)
Bed: RESB Expected date:  Expected time:  Means of arrival:  Comments: EMS-OD benedryl

## 2018-06-14 ENCOUNTER — Other Ambulatory Visit: Payer: Self-pay

## 2018-06-14 ENCOUNTER — Encounter (HOSPITAL_COMMUNITY): Payer: Self-pay | Admitting: *Deleted

## 2018-06-14 ENCOUNTER — Inpatient Hospital Stay (HOSPITAL_COMMUNITY)
Admission: AD | Admit: 2018-06-14 | Discharge: 2018-06-16 | DRG: 885 | Disposition: A | Discharge status: Home / Self Care | Payer: Medicaid Other | Source: Intra-hospital | Attending: Psychiatry | Admitting: Psychiatry

## 2018-06-14 DIAGNOSIS — Z981 Arthrodesis status: Secondary | ICD-10-CM

## 2018-06-14 DIAGNOSIS — Z79899 Other long term (current) drug therapy: Secondary | ICD-10-CM | POA: Diagnosis not present

## 2018-06-14 DIAGNOSIS — F332 Major depressive disorder, recurrent severe without psychotic features: Secondary | ICD-10-CM | POA: Diagnosis present

## 2018-06-14 DIAGNOSIS — R45851 Suicidal ideations: Secondary | ICD-10-CM

## 2018-06-14 DIAGNOSIS — Z880 Allergy status to penicillin: Secondary | ICD-10-CM | POA: Diagnosis not present

## 2018-06-14 DIAGNOSIS — T50902A Poisoning by unspecified drugs, medicaments and biological substances, intentional self-harm, initial encounter: Secondary | ICD-10-CM | POA: Diagnosis not present

## 2018-06-14 DIAGNOSIS — G8929 Other chronic pain: Secondary | ICD-10-CM | POA: Diagnosis present

## 2018-06-14 DIAGNOSIS — Z791 Long term (current) use of non-steroidal anti-inflammatories (NSAID): Secondary | ICD-10-CM | POA: Diagnosis not present

## 2018-06-14 DIAGNOSIS — Z87891 Personal history of nicotine dependence: Secondary | ICD-10-CM

## 2018-06-14 DIAGNOSIS — M545 Low back pain: Secondary | ICD-10-CM | POA: Diagnosis present

## 2018-06-14 DIAGNOSIS — G4700 Insomnia, unspecified: Secondary | ICD-10-CM | POA: Diagnosis present

## 2018-06-14 DIAGNOSIS — F419 Anxiety disorder, unspecified: Secondary | ICD-10-CM | POA: Diagnosis present

## 2018-06-14 LAB — RAPID URINE DRUG SCREEN, HOSP PERFORMED
Amphetamines: NOT DETECTED
Barbiturates: NOT DETECTED
Benzodiazepines: NOT DETECTED
Cocaine: NOT DETECTED
Opiates: NOT DETECTED
Tetrahydrocannabinol: NOT DETECTED

## 2018-06-14 MED ORDER — LORAZEPAM 2 MG/ML IJ SOLN: 0.0000 mg | Freq: Four times a day (QID) | INTRAMUSCULAR | Status: DC

## 2018-06-14 MED ORDER — TRAZODONE HCL 50 MG PO TABS
50.0000 mg | ORAL_TABLET | Freq: Every evening | ORAL | Status: DC | PRN
  Administered 2018-06-14 – 2018-06-15 (×2): 50 mg via ORAL
  Filled 2018-06-14 (×2): qty 1

## 2018-06-14 MED ORDER — VITAMIN B-1 100 MG PO TABS: 100.0000 mg | ORAL_TABLET | Freq: Every day | ORAL | Status: DC

## 2018-06-14 MED ORDER — TRAZODONE HCL 50 MG PO TABS
50.0000 mg | ORAL_TABLET | Freq: Once | ORAL | Status: AC
  Administered 2018-06-14: 50 mg via ORAL
  Filled 2018-06-14: qty 1

## 2018-06-14 MED ORDER — THIAMINE HCL 100 MG/ML IJ SOLN: 100.0000 mg | Freq: Every day | INTRAMUSCULAR | Status: DC

## 2018-06-14 MED ORDER — ONDANSETRON HCL 4 MG PO TABS: 4.0000 mg | ORAL_TABLET | Freq: Three times a day (TID) | ORAL | Status: DC | PRN

## 2018-06-14 MED ORDER — LORAZEPAM 2 MG/ML IJ SOLN: 0.0000 mg | Freq: Two times a day (BID) | INTRAMUSCULAR | Status: DC

## 2018-06-14 MED ORDER — MAGNESIUM HYDROXIDE 400 MG/5ML PO SUSP: 30.0000 mL | Freq: Every day | ORAL | Status: DC | PRN

## 2018-06-14 MED ORDER — ALUM & MAG HYDROXIDE-SIMETH 200-200-20 MG/5ML PO SUSP: 30.0000 mL | ORAL | Status: DC | PRN

## 2018-06-14 MED ORDER — CHLORDIAZEPOXIDE HCL 25 MG PO CAPS
25.0000 mg | ORAL_CAPSULE | Freq: Three times a day (TID) | ORAL | Status: DC | PRN
  Administered 2018-06-14 – 2018-06-15 (×2): 25 mg via ORAL
  Filled 2018-06-14 (×2): qty 1

## 2018-06-14 MED ORDER — LORAZEPAM 1 MG PO TABS: 0.0000 mg | ORAL_TABLET | Freq: Two times a day (BID) | ORAL | Status: DC

## 2018-06-14 MED ORDER — HYDROXYZINE HCL 25 MG PO TABS
25.0000 mg | ORAL_TABLET | ORAL | Status: DC | PRN
  Administered 2018-06-15 (×2): 25 mg via ORAL
  Filled 2018-06-14 (×2): qty 1

## 2018-06-14 MED ORDER — ACETAMINOPHEN 325 MG PO TABS
650.0000 mg | ORAL_TABLET | Freq: Four times a day (QID) | ORAL | Status: DC | PRN
  Administered 2018-06-15: 650 mg via ORAL
  Filled 2018-06-14: qty 2

## 2018-06-14 MED ORDER — LORAZEPAM 1 MG PO TABS: 0.0000 mg | ORAL_TABLET | Freq: Four times a day (QID) | ORAL | Status: DC

## 2018-06-14 MED ORDER — IBUPROFEN 200 MG PO TABS: 600.0000 mg | ORAL_TABLET | Freq: Three times a day (TID) | ORAL | Status: DC | PRN

## 2018-06-14 NOTE — BH Assessment (Addendum)
Assessment Note  Jennifer Chaney is an 27 y.o. female, who presents involuntary and unaccompanied to [facility]. 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 Pt reported, when she is unable to visit her mother in jail in [place] her father calls her "a bad daughter." Pt reported, she took the pills around 4 pm. Pt denies, SI, HI, AVH, self-injurious behaviors and access to weapons.   Pt was IVC'd by her husband. Per IVC paperwork: "Respondent sent text to husband stating she is going to kill herself and then sent a picture with pills in her hand. Respondent is drinking everyday, Respondent also stated that she was going to kill herself and that the husband would be responsible. Respondent is not eating or sleeping."   Pt denies abuse. Pt reported, drinking alcohol occasionally. Pt reported, she drank some liquor, yesterday. Pt UDS is pending. Pt denies, being linked to OPT resources. Pt denies, previous inpatient admissions.   Pt presents crying in scrubs with logical/coherent speech. Pt's eye contact was good. Pt's mood was depressed, anxious. Pt's affect was congruent with mood. Pt's thought process was coherent/relevant. Pt's judgement was impaired. Pt was oriented x4. Pt's concentration was normal. Pt's insight was fair. Pt's impulse control was poor. Pt reported, if discharged from [facility] she could contract for safety.   Diagnosis: Major Depressive Disorder, recurrent, severe, without psychosis.   Past Medical History:  Past Medical History:  Diagnosis Date  . Preterm labor    pt-contractions with first pregnancy-del term  . Scoliosis 2014     Past Surgical History:  Procedure Laterality Date  . NO PAST SURGERIES    . POSTERIOR LUMBAR FUSION 4 LEVEL N/A 09/08/2017   Procedure: Thoracic twelve - Lumbar four pedicle screw fixation. Lumbar two laminectomy for burst fracture;  Surgeon: Maeola Harman, MD;  Location: [facility] OR;  Service: Neurosurgery;  Laterality: N/A;    Family History: No family history on file.  Social History:  reports that she has quit smoking. She has never used smokeless tobacco. She reports that she drinks alcohol. She reports that she does not use drugs.  Additional Social History:  Alcohol / Drug Use Pain Medications: See MAR  Prescriptions: See MAR Over the Counter: See MAR History of alcohol / drug use?: Yes Negative Consequences of Use: Personal relationships Substance #1 Name of Substance 1: Alcohol.  1 - Age of First Use: UTA 1 - Amount (size/oz): Pt reported, drinking liquior, yesterday. Per IVC, pt drinks every day.  1 - Frequency: Daily.  1 - Duration: Ongoing.  1 - Last Use / Amount: Per IVC, daily.   CIWA: CIWA-Ar BP: 119/76 Pulse Rate: 97 Nausea and Vomiting: no nausea and no vomiting Tactile Disturbances: none Tremor: no tremor Auditory Disturbances: not present Paroxysmal Sweats: no sweat visible Visual Disturbances: not present Anxiety: moderately anxious, or guarded, so anxiety is inferred Headache, Fullness in Head: none present Agitation: three Orientation and Clouding of Sensorium: oriented and can do serial additions CIWA-Ar Total: 7 COWS:    Allergies:  Allergies  Allergen Reactions  . Penicillins Other (See Comments)  Reaction unknown Has patient had a PCN reaction causing immediate rash, facial/tongue/throat swelling, SOB or lightheadedness with hypotension: unknown Has patient had a PCN reaction causing severe rash involving mucus membranes or skin necrosis: unknown Has patient had a PCN reaction that required hospitalization: yes Has patient had a PCN reaction  occurring within the last 10 years: yes - when I was in labor at the hospital If all of the above answers are "NO", then may proceed with     Home Medications:  (Not in a hospital admission)  OB/GYN Status:  Patient's last menstrual period was 04/13/2018.  General Assessment Data [facility] of Assessment: [facility] TTS Assessment: In system Is this a Tele or Face-to-Face Assessment?: Face-to-Face Is this an Initial Assessment or a Re-assessment for this encounter?: Initial Assessment Patient Accompanied by:: N/A Language Other than English: Yes What is your preferred language: Spanish Living Arrangements: Other (Comment)(Husband and three daughters. ) What gender do you identify as?: Female Marital status: Married [place] name: Roduiz. Pregnancy Status: No Living Arrangements: Spouse/significant other, Children Can pt return to current living arrangement?: Yes Admission Status: Involuntary Petitioner: Other(Husband.) Is patient capable of signing voluntary admission?: No Referral Source: Other([employer]) Insurance type: Medicaid.      Crisis Care Plan Living Arrangements: Spouse/significant other, Children Legal Guardian: Other:(Self. ) Name of Psychiatrist: NA Name of Therapist: NA  Education Status Is patient currently in school?: No Is the patient employed, unemployed or receiving disability?: Unemployed  Risk to self with the past 6 months Suicidal Ideation: Yes-Currently Present(Per IVC pt reported, not currently. ) Has patient been a risk to self within the past 6 months prior to admission? : Yes Suicidal Intent: Yes-Currently Present(Per IVC pt reported, not currently. ) Has patient had any suicidal intent within the past 6 months prior to admission? : Yes Is patient at risk for suicide?: Yes Suicidal Plan?: Yes-Currently Present(Per IVC pt reported, not currently. ) Has patient had any suicidal plan within the past 6 months prior to admission? : No Specify Current  Suicidal Plan: Pt took 20 sleeping pills on 06/13/2018. Access to Means: Yes Specify Access to Suicidal Means: Sleeping pills.  What has been your use of drugs/alcohol within the last 12 months?: Alcohol.  Previous Attempts/Gestures: No How many times?: 0 Other Self Harm Risks: NA Triggers for Past Attempts: None known Intentional Self Injurious Behavior: None(Pt denies. 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 ) Does patient have any lifetime risk of violence toward others beyond the six months prior to admission? : No Thoughts of Harm to Others: No Current Homicidal Intent: No Current Homicidal Plan: No Access to Homicidal Means: No Identified Victim: NA History of harm to others?: No Assessment of Violence: None Noted Does patient have access to weapons?: No Criminal Charges Pending?: No Does patient have a court date: No Is patient on probation?: No  Psychosis Hallucinations: None noted Delusions: None noted  Mental Status Report Appearance/Hygiene: In scrubs Eye Contact: Good Motor Activity: Unremarkable Speech: Logical/coherent Level of Consciousness: Crying Mood: Depressed, Anxious Affect: Other (Comment)(congruent with mood. ) Anxiety Level: Severe Thought Processes: Coherent,  Relevant Judgement: Impaired Orientation: Person, Place, Time, Situation Obsessive Compulsive Thoughts/Behaviors: None  Cognitive  Functioning Concentration: Normal Memory: Recent Intact Is patient IDD: No Insight: Fair Impulse Control: Poor Appetite: Fair Have you had any weight changes? : Loss Amount of the weight change? (lbs): 23 lbs(in March. ) Sleep: Decreased Total Hours of Sleep: 6 Vegetative Symptoms: None  ADLScreening [facility] Assessment Services) Patient's cognitive ability adequate to safely complete daily activities?: Yes Patient able to express need for assistance with ADLs?: Yes Independently performs ADLs?: Yes (appropriate for developmental age)  Prior Inpatient Therapy Prior Inpatient Therapy: No  Prior Outpatient Therapy Prior Outpatient Therapy: No Does patient have an ACCT team?: No Does patient have Intensive In-House Services?  : No Does patient have [facility] services? : No Does patient have P4CC services?: No  ADL Screening (condition at time of admission) Patient's cognitive ability adequate to safely complete daily activities?: Yes Is the patient deaf or have difficulty hearing?: No Does the patient have difficulty seeing, even when wearing glasses/contacts?: No Does the patient have difficulty concentrating, remembering, or making decisions?: No Patient able to express need for assistance with ADLs?: Yes Does the patient have difficulty dressing or bathing?: No Independently performs ADLs?: Yes (appropriate for developmental age) Does the patient have difficulty walking or climbing stairs?: No Weakness of Legs: None Weakness of Arms/Hands: None  Home Assistive Devices/Equipment Home Assistive Devices/Equipment: None    Abuse/Neglect Assessment (Assessment to be complete while patient is alone) Abuse/Neglect Assessment Can Be Completed: Yes Physical Abuse: Denies(Pt denies. ) Verbal Abuse: Denies(Pt denies. ) Sexual Abuse: Denies(Pt denies. ) Exploitation of patient/patient's resources: Denies(Pt denies. ) Self-Neglect: Denies(Pt denies. )     Advance Directives (For  Healthcare) Does Patient Have a Medical Advance Directive?: No Nutrition Screen- [facility] Adult/[facility] Patient's home diet: (pt. given juice per CN.)        Disposition: Donell SievertSpencer Simon, PA recommends inpatient treatment. Disposition discussed with Mardella LaymanLindsey, [place] and Melina Schoolsobyn, RN, TTS to seek placement.    Disposition Initial Assessment Completed for this Encounter: Yes  On Site Evaluation by: Redmond Pullingreylese D Mariajose Mow, MS, LPC, CRC. Reviewed with Physician: Mardella LaymanLindsey, PA and Donell SievertSpencer Simon, PA.  Redmond Pullingreylese D Eupha Lobb 06/14/2018 2:11 AM

## 2018-06-14 NOTE — ED Notes (Signed)
Specimen cup provided

## 2018-06-14 NOTE — ED Notes (Addendum)
Pt attempting to leave. Pt becoming verbally aggressive towards staff. Pt was left in her leggings and sneakers. Pt still had cell phone as well. All belongings taken from pt including phone. Pt had sleep aid pill stuck in her sneaker.

## 2018-06-14 NOTE — BHH Suicide Risk Assessment (Signed)
[facility]   Nursing information obtained from:    Demographic factors:    Current Mental Status:    Loss Factors:    Historical Factors:    Risk Reduction Factors:     Total Time spent with patient: 30 minutes Principal Problem: <principal problem not specified> Diagnosis:  Active Problems:   * No active hospital problems. *  Subjective Data: Patient is seen and examined.  Patient is a 27 year old Hispanic female who [facility] department on 06/14/2018 under involuntary commitment.  The patient stated that she found out in March that her husband was having an affair.  She stated since then things had not been going well.  They had decided to work things out, but they had an argument earlier this week and the husband left the home.  She stated yesterday that the husband's girlfriend contacted her by telephone and told her that the husband was staying with her at the time.  She stated she decided at that point to "get back at him".  She went to a hotel parking lot, and apparently took a picture of pills.  She also communicated to him that she was a failure as a mother, and that everything was bad.  Patient also reported she was unable to visit her mother in jail.  She also reportedly told people in the emergency room that her father calls her "a bad daughter".  She reportedly took pills approximately around 4 PM.  The patient denied having taking the pills, she also reported earlier that she was drinking 2 bottles of wine, but now she denies that as well.  She stated she is "not crazy".  She had been involuntarily committed by her husband.  The husband stated in the IVC paperwork that attacks was sent to him stating she was going to kill herself, and then send a picture with pills in her hand.  According to the husband's involuntary commitment paperwork the respondent was drinking every day.  The patient also was reportedly  stating she was going to kill herself and that her husband would be responsible.  She was reportedly not eating or drinking.  The patient denied every day alcohol use, and stated that she would drink a bottle of wine over 7 days.  Her drug screen was negative, her blood alcohol was negative.  She denied any previous psychiatric admissions.  She denied any previous psychiatric treatment.  She was involuntarily committed and sent to the hospital for evaluation and stabilization.  Continued Clinical Symptoms:    The "Alcohol Use Disorders Identification Test", Guidelines for Use in Primary Care, Second Edition.  World Science writerHealth Organization [facility](WHO). Score between 0-7:  no or low risk or alcohol related problems. Score between 8-15:  moderate risk of alcohol related problems. Score between 16-19:  high risk of alcohol related problems. Score 20 or above:  warrants further diagnostic evaluation for alcohol dependence and treatment.   CLINICAL FACTORS:   Depression:   Anhedonia Comorbid alcohol abuse/dependence Hopelessness Impulsivity Insomnia   Musculoskeletal: Strength & Muscle Tone: within normal limits Gait & Station: normal Patient leans: N/A  Psychiatric Specialty Exam: Physical Exam  Constitutional: She is oriented to person, place, and time. She appears well-developed and well-nourished.  HENT:  Head: Normocephalic and atraumatic.  Respiratory: Effort normal.  Neurological: She is alert and oriented to person, place, and time.    ROS  There were no vitals taken for this visit.There is no  height or weight on file to calculate BMI.  General Appearance: Disheveled  Eye Contact:  Fair  Speech:  Normal Rate  Volume:  Increased  Mood:  Anxious and Depressed  Affect:  Congruent  Thought Process:  Coherent and Descriptions of Associations: Circumstantial  Orientation:  Full (Time, Place, and Person)  Thought Content:  Logical  Suicidal Thoughts:  No  Homicidal Thoughts:  No   Memory:  Immediate;   Fair Recent;   Fair Remote;   Fair  Judgement:  Impaired  Insight:  Lacking  Psychomotor Activity:  Increased  Concentration:  Concentration: Fair and Attention Span: Fair  Recall:  [employer] of Knowledge:  Good  Language:  Good  Akathisia:  Negative  Handed:  Right  AIMS (if indicated):     Assets:  Communication Skills Desire for Improvement Housing Physical Health Resilience  ADL's:  Intact  Cognition:  WNL  Sleep:         COGNITIVE FEATURES THAT CONTRIBUTE TO RISK:  None    SUICIDE RISK:   Mild:  Suicidal ideation of limited frequency, intensity, duration, and specificity.  There are no identifiable plans, no associated intent, mild dysphoria and related symptoms, good self-control (both objective and subjective assessment), few other risk factors, and identifiable protective factors, including available and accessible social support.  PLAN OF CARE: Patient is seen and examined.  Patient is a 27 year old female who was involuntarily committed after reported overdose of Benadryl.  She denies all of this.  She stated that she did this to get back to her husband.  She denied any previous psychiatric evaluations, she denied any previous psychiatric treatment, she denied any previous suicide attempts, she denied any previous psychiatric admissions.  She will be admitted to the hospital.  She will be integrated into the milieu.  We will attempt to contact her father for collateral information.  She is quite tearful and agitated this point.  She does not agree that she is depressed or anxious, and refuses medications at this time.  Hopefully we can work with her over the next day or 2 and get her to communicate more fully about what is been going on at home.  I certify that inpatient services furnished can reasonably be expected to improve the patient's condition.   Antonieta Pert, MD 06/14/2018, 3:27 PM

## 2018-06-14 NOTE — BHH Counselor (Addendum)
Per Selena BattenKim, RN, [facility], pending completed IVC paperwork ([facility]) and urine drug screen. Once IVC paperwork is completed [facility] and day shift AC will provide bed information. Discussed with Melina Schoolsobyn, RN.  Redmond Pullingreylese D Bristal Steffy, MS, [facility], [facility] Triage Specialist [phone]

## 2018-06-14 NOTE — BHH Counselor (Signed)
Pt's IV[facility] paperwork [facility].  Redmond Pullingreylese D Mung Rinker, MS, [facility], [facility] Triage Specialist [phone]

## 2018-06-14 NOTE — Progress Notes (Signed)
Patient ID: Jennifer Chaney, female   DOB: October 12, 1990, 27 y.o.   MRN: [id number]    Admission Note: Patient is a married  27 yo spanish / english speaking female who is admitted involuntarily to bhh today after having conflict with her husband yesterday and threatened husband with text ( with bunches of her medicines IN the text, with note that she was preparing to take the pills in an overdose attempt ). She is tearful, agitated, upset and demanding to " let me go home", initially when she just arrived in this buildgin and now after being on the unit for 3-4 hours, she is observed sitting in the dayroom , talking to her family on hte pnone and getting to know her peers. She says her depression has " just been getting worse and worse everyday" and it all came to a head recently because she just then found out h=who her husband's GF actually is. She denies PMH of any medical problem as well as any previous mental health hosptialization. She says her 3 children ( 10yo, 6yo nd 164yo) are with her husband, and she adamantly denies taking ANY pills yesterday, admitting that " it was all an attempt to get his attentioona" She contracts for safeyt, is oriented to the unit and admission is completed.

## 2018-06-14 NOTE — Progress Notes (Signed)
D.  Pt anxious on approach, states that she wants to be given medication to help her sleep.  Pt states "I just want to sleep" . Pt denies SI/HI/AVH at this time.  Pt is a new admission to the unit, see admission note.  Minimal interaction on unit.  A.  Support and encouragement offered, medication given as ordered.  R.  Pt remains safe on the unit, will continue to monitor.

## 2018-06-14 NOTE — ED Notes (Signed)
Bed: [id number] Expected date:  Expected time:  Means of arrival:  Comments: Res B

## 2018-06-14 NOTE — Tx Team (Signed)
Initial Treatment Plan 06/14/2018 6:29 PM Jennifer ApleyEusleydi Gilpin [id number]    PATIENT STRESSORS: Health problems Marital or family conflict   PATIENT STRENGTHS: Ability for insight Active sense of humor   PATIENT IDENTIFIED PROBLEMS: MDD with SI                " I miss my children"  " I wanna go home"   DISCHARGE CRITERIA:  Ability to meet basic life and health needs  PRELIMINARY DISCHARGE PLAN: Attend aftercare/continuing care group  PATIENT/FAMILY INVOLVEMENT: This treatment plan has been presented to and reviewed with the patient, Jennifer Chaney, and/or family member, .  The patient and family have been given the opportunity to ask questions and make suggestions.  Rich Braveuke, Ramere Downs Lynn, RN 06/14/2018, 6:29 PM

## 2018-06-14 NOTE — H&P (Signed)
Psychiatric Admission Assessment Adult  Patient Identification: Jennifer Chaney Qualley MRN:  [id number] Date of Evaluation:  06/14/2018 Chief Complaint:  MDD recurrent severe Principal Diagnosis: <principal problem not specified> Diagnosis:  Active Problems:   MDD (major depressive disorder), recurrent episode, severe (HCC)  History of Present Illness: Patient is seen and examined.  Patient is a 27 year old Hispanic female who [facility] to [facility] emergency department on 06/14/2018 under involuntary commitment.  The patient stated that she found out in March that her husband was having an affair.  She stated since then things had not been going well.  They had decided to work things out, but they had an argument earlier this week and the husband left the home.  She stated yesterday that the husband's girlfriend contacted her by telephone and told her that the husband was staying with her at the time.  She stated she decided at that point to "get back at him".  She went to a hotel parking lot, and apparently took a picture of pills.  She also communicated to him that she was a failure as a mother, and that everything was bad.  Patient also reported she was unable to visit her mother in jail.  She also reportedly told people in the emergency room that her father calls her "a bad daughter".  She reportedly took pills approximately around 4 PM.  The patient denied having taking the pills, she also reported earlier that she was drinking 2 bottles of wine, but now she denies that as well.  She stated she is "not crazy".  She had been involuntarily committed by her husband.  The husband stated in the IVC paperwork that attacks was sent to him stating she was going to kill herself, and then send a picture with pills in her hand.  According to the husband's involuntary commitment paperwork the respondent was drinking every day.  The patient also was reportedly stating she was going to kill  herself and that her husband would be responsible.  She was reportedly not eating or drinking.  The patient denied every day alcohol use, and stated that she would drink a bottle of wine over 7 days.  Her drug screen was negative, her blood alcohol was negative.  She denied any previous psychiatric admissions.  She denied any previous psychiatric treatment.  She was involuntarily committed and sent to the hospital for evaluation and stabilization.  Associated Signs/Symptoms: Depression Symptoms:  depressed mood, anhedonia, insomnia, psychomotor agitation, fatigue, feelings of worthlessness/guilt, difficulty concentrating, hopelessness, suicidal thoughts without plan, anxiety, loss of energy/fatigue, disturbed sleep, (Hypo) Manic Symptoms:  Impulsivity, Irritable Mood, Labiality of Mood, Anxiety Symptoms:  Excessive Worry, Psychotic Symptoms:  Denied PTSD Symptoms: Negative Total Time spent with patient: 30 minutes  Past Psychiatric History: Patient denied any previous psychiatric admissions, she denied any previous psychiatric treatment, she denied any previous suicidal ideation.  Is the patient at risk to self? Yes.    Has the patient been a risk to self in the past 6 months? No.  Has the patient been a risk to self within the distant past? No.  Is the patient a risk to others? No.  Has the patient been a risk to others in the past 6 months? No.  Has the patient been a risk to others within the distant past? No.   Prior Inpatient Therapy:   Prior Outpatient Therapy:    Alcohol Screening:   Substance Abuse History in the last 12 months:  No. Consequences  of Substance Abuse: Negative Previous Psychotropic Medications: No  Psychological Evaluations: No  Past Medical History:  Past Medical History:  Diagnosis Date  . Preterm labor    pt-contractions with first pregnancy-del term  . Scoliosis 2014    Past Surgical History:  Procedure Laterality Date  . NO PAST SURGERIES     . POSTERIOR LUMBAR FUSION 4 LEVEL N/A 09/08/2017   Procedure: Thoracic twelve - Lumbar four pedicle screw fixation. Lumbar two laminectomy for burst fracture;  Surgeon: Maeola Harman, MD;  Location: [facility] OR;  Service: Neurosurgery;  Laterality: N/A;   Family History: No family history on file. Family Psychiatric  History: Patient denied any previous psychiatric history in her family Tobacco Screening:   Social History:  Social History   Substance and Sexual Activity  Alcohol Use Yes   Comment: occasional      Social History   Substance and Sexual Activity  Drug Use No    Additional Social History:                           Allergies:   Allergies  Allergen Reactions  . Penicillins Other (See Comments)    Reaction unknown Has patient had a PCN reaction causing immediate rash, facial/tongue/throat swelling, SOB or lightheadedness with hypotension: unknown Has patient had a PCN reaction causing severe rash involving mucus membranes or skin necrosis: unknown Has patient had a PCN reaction that required hospitalization: yes Has patient had a PCN reaction occurring within the last 10 years: yes - when I was in labor at the hospital If all of the above answers are "NO", then may proceed with    Lab Results:  Results for orders placed or performed during the hospital encounter of 06/13/18 (from the past 48 hour(s))  Comprehensive metabolic panel     Status: Abnormal   Collection Time: 06/13/18  7:20 PM  Result Value Ref Range   Sodium 141 135 - 145 mmol/L   Potassium 3.6 3.5 - 5.1 mmol/L   Chloride 106 98 - 111 mmol/L   CO2 22 22 - 32 mmol/L   Glucose, Bld 70 70 - 99 mg/dL   BUN 14 6 - 20 mg/dL   Creatinine, Ser 1.61 0.44 - 1.00 mg/dL   Calcium 9.6 8.9 - 09.6 mg/dL   Total Protein 8.4 (H) 6.5 - 8.1 g/dL   Albumin 4.9 3.5 - 5.0 g/dL   AST 19 15 - 41 U/L   ALT 14 0 - 44 U/L   Alkaline Phosphatase 41 38 - 126 U/L   Total Bilirubin 1.2 0.3 - 1.2 mg/dL   GFR calc non  Af Amer >60 >60 mL/min   GFR calc Af Amer >60 >60 mL/min   Anion gap 13 5 - 15    Comment: Performed at [facility], [address], [place], [place] [address]  Ethanol     Status: None   Collection Time: 06/13/18  7:20 PM  Result Value Ref Range   Alcohol, Ethyl (B) <10 <10 mg/dL    Comment: (NOTE) Lowest detectable limit for serum alcohol is 10 mg/dL. For medical purposes only. Performed at [facility], [address] [place], [place] [address]   Salicylate level     Status: None   Collection Time: 06/13/18  7:20 PM  Result Value Ref Range   Salicylate Lvl <7.0 2.8 - 30.0 mg/dL    Comment: Performed at [facility], [address], [place], [place] [address]  Acetaminophen level     Status: Abnormal   Collection Time: 06/13/18  7:20 PM  Result Value Ref Range   Acetaminophen (Tylenol), Serum <10 (L) 10 - 30 ug/mL    Comment: (NOTE) Therapeutic concentrations vary significantly. A range of 10-30 ug/mL  may be an effective concentration for many patients. However, some  are best treated at concentrations outside of this range. Acetaminophen concentrations >150 ug/mL at 4 hours after ingestion  and >50 ug/mL at 12 hours after ingestion are often associated with  toxic reactions. Performed at [facility], [address], [place], [place] [address]   Rapid urine drug screen (hospital performed)     Status: None   Collection Time: 06/13/18  7:20 PM  Result Value Ref Range   Opiates NONE DETECTED NONE DETECTED   Cocaine NONE DETECTED NONE DETECTED   Benzodiazepines NONE DETECTED NONE DETECTED   Amphetamines NONE DETECTED NONE DETECTED   Tetrahydrocannabinol NONE DETECTED NONE DETECTED   Barbiturates NONE DETECTED NONE DETECTED    Comment: (NOTE) DRUG SCREEN FOR MEDICAL PURPOSES ONLY.  IF CONFIRMATION IS NEEDED FOR ANY PURPOSE, NOTIFY LAB WITHIN 5 DAYS. LOWEST DETECTABLE LIMITS FOR URINE DRUG  SCREEN Drug Class                     Cutoff (ng/mL) Amphetamine and metabolites    1000 Barbiturate and metabolites    200 Benzodiazepine                 200 Tricyclics and metabolites     300 Opiates and metabolites        300 Cocaine and metabolites        300 THC                            50 Performed at [facility], [address], [place], [place] [address]   I-Stat beta hCG blood, ED     Status: None   Collection Time: 06/13/18  7:28 PM  Result Value Ref Range   I-stat hCG, quantitative <5.0 <5 mIU/mL   Comment 3            Comment:   GEST. AGE      CONC.  (mIU/mL)   <=1 WEEK        5 - 50     2 WEEKS       50 - 500     3 WEEKS       100 - 10,000     4 WEEKS     1,000 - 30,000        FEMALE AND NON-PREGNANT FEMALE:     LESS THAN 5 mIU/mL   CBC with Differential/Platelet     Status: None   Collection Time: 06/13/18  8:00 PM  Result Value Ref Range   WBC 9.1 4.0 - 10.5 K/uL   RBC 4.80 3.87 - 5.11 MIL/uL   Hemoglobin 14.9 12.0 - 15.0 g/dL   HCT 91.4 78.2 - 95.6 %   MCV 95.2 80.0 - 100.0 fL   MCH 31.0 26.0 - 34.0 pg   MCHC 32.6 30.0 - 36.0 g/dL   RDW 21.3 08.6 - 57.8 %   Platelets 277 150 - 400 K/uL   nRBC 0.0 0.0 - 0.2 %   Neutrophils Relative % 82 %   Neutro Abs 7.5 1.7 - 7.7 K/uL  Lymphocytes Relative 11 %   Lymphs Abs 1.0 0.7 - 4.0 K/uL   Monocytes Relative 6 %   Monocytes Absolute 0.5 0.1 - 1.0 K/uL   Eosinophils Relative 0 %   Eosinophils Absolute 0.0 0.0 - 0.5 K/uL   Basophils Relative 1 %   Basophils Absolute 0.1 0.0 - 0.1 K/uL   Immature Granulocytes 0 %   Abs Immature Granulocytes 0.04 0.00 - 0.07 K/uL    Comment: Performed at [facility], [address], [place], [place] [address]    Blood Alcohol level:  Lab Results  Component Value Date   ETH <10 06/13/2018    Metabolic Disorder Labs:  No results found for: HGBA1C, MPG No results found for: PROLACTIN No results found for: CHOL, TRIG, HDL, CHOLHDL,  VLDL, LDLCALC  Current Medications: Current Facility-Administered Medications  Medication Dose Route Frequency Provider Last Rate Last Dose  . acetaminophen (TYLENOL) tablet 650 mg  650 mg Oral Q6H PRN Antonieta Pert, MD      . alum & mag hydroxide-simeth (MAALOX/MYLANTA) 200-200-20 MG/5ML suspension 30 mL  30 mL Oral Q4H PRN Antonieta Pert, MD      . hydrOXYzine (ATARAX/VISTARIL) tablet 25 mg  25 mg Oral Q4H PRN Antonieta Pert, MD      . magnesium hydroxide (MILK OF MAGNESIA) suspension 30 mL  30 mL Oral Daily PRN Antonieta Pert, MD      . traZODone (DESYREL) tablet 50 mg  50 mg Oral QHS PRN Antonieta Pert, MD       PTA Medications: Medications Prior to Admission  Medication Sig Dispense Refill Last Dose  . acetaminophen (TYLENOL) 500 MG tablet Take 500 mg by mouth every 6 (six) hours as needed for mild pain.   Past Month at Unknown time  . ibuprofen (ADVIL,MOTRIN) 200 MG tablet Take 200 mg by mouth every 6 (six) hours as needed for mild pain.   Past Month at Unknown time  . levofloxacin (LEVAQUIN) 500 MG tablet Take 1 tablet (500 mg total) by mouth daily. (Patient not taking: Reported on 06/13/2018) 7 tablet 0 Not Taking at Unknown time  . methocarbamol (ROBAXIN) 500 MG tablet Take 1 tablet (500 mg total) by mouth every 6 (six) hours as needed for muscle spasms. (Patient not taking: Reported on 11/06/2017) 60 tablet 1 Not Taking  . ondansetron (ZOFRAN) 4 MG tablet Take 1 tablet (4 mg total) by mouth every 6 (six) hours. (Patient not taking: Reported on 06/13/2018) 12 tablet 0 Not Taking at Unknown time  . oxyCODONE (OXY IR/ROXICODONE) 5 MG immediate release tablet Take 1-2 tablets (5-10 mg total) by mouth every 3 (three) hours as needed for moderate pain or severe pain ((score 4 to 6)). (Patient not taking: Reported on 11/06/2017) 60 tablet 0 Not Taking at Unknown time    Musculoskeletal: Strength & Muscle Tone: within normal limits Gait & Station: normal Patient leans:  N/A  Psychiatric Specialty Exam: Physical Exam  Nursing note and vitals reviewed. Constitutional: She is oriented to person, place, and time. She appears well-developed and well-nourished.  HENT:  Head: Normocephalic and atraumatic.  Respiratory: Effort normal.  Neurological: She is alert and oriented to person, place, and time.    ROS  There were no vitals taken for this visit.There is no height or weight on file to calculate BMI.  General Appearance: Disheveled  Eye Contact:  Fair  Speech:  Normal Rate  Volume:  Increased  Mood:  Anxious, Depressed and Irritable  Affect:  Congruent  Thought Process:  Coherent and Descriptions of Associations: Circumstantial  Orientation:  Full (Time, Place, and Person)  Thought Content:  Logical  Suicidal Thoughts:  No  Homicidal Thoughts:  No  Memory:  Immediate;   Fair Recent;   Fair Remote;   Fair  Judgement:  Impaired  Insight:  Lacking  Psychomotor Activity:  Increased  Concentration:  Concentration: Fair and Attention Span: Fair  Recall:  [employer] of Knowledge:  Fair  Language:  Fair  Akathisia:  Negative  Handed:  Right  AIMS (if indicated):     Assets:  Communication Skills Desire for Improvement Housing Leisure Time Physical Health Resilience Social Support  ADL's:  Intact  Cognition:  WNL  Sleep:       Treatment Plan Summary: Daily contact with patient to assess and evaluate symptoms and progress in treatment, Medication management and Plan : Patient is seen and examined.  Patient is a 27 year old female who was involuntarily committed after reported overdose of Benadryl.  She denies all of this.  She stated that she did this to get back to her husband.  She denied any previous psychiatric evaluations, she denied any previous psychiatric treatment, she denied any previous suicide attempts, she denied any previous psychiatric admissions.  She will be admitted to the hospital.  She will be integrated into the milieu.  We  will attempt to contact her father for collateral information.  She is quite tearful and agitated this point.  She does not agree that she is depressed or anxious, and refuses medications at this time.  Hopefully we can work with her over the next day or 2 and get her to communicate more fully about what is been going on at home.  Observation Level/Precautions:  15 minute checks  Laboratory:  Chemistry Profile  Psychotherapy:    Medications:    Consultations:    Discharge Concerns:    Estimated LOS:  Other:     Physician Treatment Plan for Primary Diagnosis: <principal problem not specified> Long Term Goal(s): Improvement in symptoms so as ready for discharge  Short Term Goals: Ability to identify changes in lifestyle to reduce recurrence of condition will improve, Ability to verbalize feelings will improve, Ability to disclose and discuss suicidal ideas, Ability to demonstrate self-control will improve, Ability to identify and develop effective coping behaviors will improve and Ability to maintain clinical measurements within normal limits will improve  Physician Treatment Plan for Secondary Diagnosis: Active Problems:   MDD (major depressive disorder), recurrent episode, severe (HCC)  Long Term Goal(s): Improvement in symptoms so as ready for discharge  Short Term Goals: Ability to identify changes in lifestyle to reduce recurrence of condition will improve, Ability to verbalize feelings will improve, Ability to disclose and discuss suicidal ideas, Ability to demonstrate self-control will improve, Ability to identify and develop effective coping behaviors will improve and Ability to maintain clinical measurements within normal limits will improve  I certify that inpatient services furnished can reasonably be expected to improve the patient's condition.    Antonieta Pert, MD 12/6/20193:35 PM

## 2018-06-14 NOTE — ED Provider Notes (Signed)
6:30 AM  [facility].  I have completed the rest of her IVC papers that were initiated by her husband.   Ward, Layla MawKristen N, DO 06/14/18 [phone]

## 2018-06-14 NOTE — ED Notes (Signed)
[employer] on unit to transfer pt to [facility] Adult unit per MD order. Personal property given to [employer] for transfer. Pt ambulatory off unit in police custody.  

## 2018-06-14 NOTE — ED Notes (Signed)
Pt reports to this nurse that she never took Benadryl pills, that she lied and told her husband this to make him feel bad. Pt presents with sad affect, appears to be minimizing. Encouragement and support provided.  Jacki ConesLaurie NP and Dr. Sharma CovertNorman made aware.

## 2018-06-14 NOTE — BH Assessment (Signed)
[facility] Assessment Progress Note  Per Donell SievertSpencer Simon, PA, this pt requires psychiatric hospitalization.  Berneice Heinrichina Tate, RN, [facility].  Pt presents under IVC initiated by pt husband, and upheld by EDP Kristen Ward, DO, and [facility]C documents [facility].  Pt's nurse, Morrie Sheldonshley, has been notified, and agrees to call report to [phone].  Pt is to be transported via [employer].   Doylene Canninghomas Narya Beavin, [place] Behavioral Health Coordinator [phone]

## 2018-06-14 NOTE — ED Notes (Signed)
Pt alert and oriented. Pt denies si,hi, and avh at this time. Pt yelling, tearful and uncooperative. Pt states " I want to go home, I made a stupid decision". Pt in room at this time, will continue to monitor.

## 2018-06-15 LAB — GLUCOSE, CAPILLARY: Glucose-Capillary: 77 mg/dL (ref 70–99)

## 2018-06-15 MED ORDER — IBUPROFEN 600 MG PO TABS
600.0000 mg | ORAL_TABLET | Freq: Four times a day (QID) | ORAL | Status: DC | PRN
  Administered 2018-06-15 – 2018-06-16 (×2): 600 mg via ORAL
  Filled 2018-06-15 (×2): qty 1

## 2018-06-15 NOTE — [facility] Group Notes (Signed)
LCSW Group Therapy Note  06/15/2018    10:00-11:00am   Type of Therapy and Topic:  Group Therapy: Early Messages Received About Anger  Participation Level:  Did Not Attend   Description of Group:   In this group, patients shared and discussed the early messages received in their lives about anger through parental or other adult modeling, teaching, repression, punishment, violence, and more.  Participants identified how those childhood lessons influence even now how they usually or often react when angered.  The group discussed that anger is a secondary emotion and what may be the underlying emotional themes that come out through anger outbursts or that are ignored through anger suppression.  Finally, as a group there was a conversation about the workbook's quote that "There is nothing wrong with anger; it is just a sign something needs to change."     Therapeutic Goals: 1. Patients will identify one or more childhood message about anger that they received and how it was taught to them. 2. Patients will discuss how these childhood experiences have influenced and continue to influence their own expression or repression of anger even today. 3. Patients will explore possible primary emotions that tend to fuel their secondary emotion of anger. 4. Patients will learn that anger itself is normal and cannot be eliminated, and that healthier coping skills can assist with resolving conflict rather than worsening situations.  Summary of Patient Progress:  N/A  Therapeutic Modalities:   Cognitive Behavioral Therapy Motivation Interviewing  [name] [name] [name]  .

## 2018-06-15 NOTE — BH Assessment (Signed)
Clinical Social Work Note  With pt's written consent, CSW called her sister [name] [phone] to get collateral information.  Sister stated that the behavior she witnessed prior to calling the police to the hotel was unusual for pt.  Pt had dropped her 3 daughters off to sister earlier in the day and did not look upset at all, did not show sadness.  However, throughout the day when sister called, pt would not answer and this was unlike her.  Sister said that at no point did pt make any statements of wanting to hurt herself, of taking pills, or anything.   Finally, pt indicated she was in hotel, which scared sister who made an assumption she might be intending self-harm, then called the police to do a wellness check.  "Mental illness is a problem so I didn't know if she was in that stage.  I overdid it, when I called the cops."    Sister said she is unaware if pt is going through marital problems, and the couple is "normal" in her presence.  She also said she is not concerned about pt having any drug or alcohol issues.  When sister talked to pt when she first arrived at the hospital, pt sounded calm, then later said there are crazy people here and wanted to leave.  No concerns about pt being discharged were expressed.  The family could arrange for someone to be with her 24 hours a day.  Suicide Prevention Education was provided (see note).  [name], Jennifer Chaney 06/15/2018, 3:17 PM

## 2018-06-15 NOTE — Plan of Care (Signed)
  Problem: Education: Goal: Knowledge of [facility] Education information/materials will improve Outcome: Not Progressing   

## 2018-06-15 NOTE — [facility] Group Notes (Signed)
Pt did not attend RN Psychoeducational group today. 

## 2018-06-15 NOTE — [facility] Suicide Risk Assessment (Signed)
[facility] INPATIENT:  Family/Significant Other Suicide Prevention Education  Suicide Prevention Education:  Education Completed; sister Jennifer Chaney [phone],  (name of family member/significant other) has been identified by the patient as the family member/significant other with whom the patient will be residing, and identified as the person(s) who will aid the patient in the event of a mental health crisis (suicidal ideations/suicide attempt).  With written consent from the patient, the family member/significant other has been provided the following suicide prevention education, prior to the and/or following the discharge of the patient.  The suicide prevention education provided includes the following:  Suicide risk factors  Suicide prevention and interventions  National Suicide Hotline telephone number  [facility] assessment telephone number  [facility] Emergency Assistance 911  [facility] and/or Residential Mobile Crisis Unit telephone number  Request made of family/significant other to:  Remove weapons (e.g., guns, rifles, knives), all items previously/currently identified as safety concern.    Remove drugs/medications (over-the-counter, prescriptions, illicit drugs), all items previously/currently identified as a safety concern.  The family member/significant other verbalizes understanding of the suicide prevention education information provided.  The family member/significant other agrees to remove the items of safety concern listed above.  Jennifer JaegerMareida J Chaney 06/15/2018, 3:26 PM

## 2018-06-15 NOTE — Progress Notes (Signed)
Pt in bed, tearful, complaint of severe back pain.  Pt states that she had back surgery in April and this is what is causing the pain.  Pt wishes to speak to a doctor in the morning, requests pain medication at this time (see pain flow sheet).  Medication given as ordered, ginger ale given at Pt request.  Pt remains safe, will continue to monitor.

## 2018-06-15 NOTE — Progress Notes (Signed)
Back pain unrelieved by Tylenol, spoke with Donell SievertSpencer Simon PA and new order received.  Medication given to Pt, will continue to monitor.

## 2018-06-15 NOTE — [facility] Counselor (Signed)
Clinical Social Work Note  Phone call attempted with written consent to pt's husband Myrlene BrokerYranol Navaroo [phone].  HIPAA-compliant voicemail left.  [name], LCSW 06/15/2018, 3:28 PM

## 2018-06-15 NOTE — B[facility] Counselor (Addendum)
Adult Comprehensive Assessment  Patient ID: Jennifer Chaney, female   DOB: 1991-05-27, 27 y.o.   MRN: [id number]  Information Source: Information source: Patient  Current Stressors:  Patient states their primary concerns and needs for treatment are:: I don't have any concerns about me. Patient states their goals for this hospitilization and ongoing recovery are:: Getting out of the hospital. Educational / Learning stressors: Denies stressors Employment / Job issues: Denies stressors Family Relationships: Husband has cheated on her multiple times and she wants to get a divorce. Financial / Lack of resources (include bankruptcy): Denies stressors Housing / Lack of housing: Denies stressors Physical health (include injuries & life threatening diseases): Back surgery, pain interferes with her sleep Social relationships: Denies stressors Substance abuse: Denies stressors Bereavement / Loss: Denies stressors  Living/Environment/Situation:  Living Arrangements: Spouse/significant other, Children Living conditions (as described by patient or guardian): Good Who else lives in the home?: Husband, 3 children How long has patient lived in current situation?: 13 years What is atmosphere in current home: Comfortable  Family History:  Marital status: Married Number of Years Married: 13 What types of issues is patient dealing with in the relationship?: He has cheated on her repeatedly Are you sexually active?: Yes What is your sexual orientation?: Heterosexual Does patient have children?: Yes How many children?: 3 How is patient's relationship with their children?: 10yo, 6yo, 4yo daughters - good with all  Childhood History:  By whom was/is the patient raised?: Both parents Description of patient's relationship with caregiver when they were a child: Very good with both parents Patient's description of current relationship with people who raised him/her: Very good with both still How were you  disciplined when you got in trouble as a child/adolescent?: Mother would take things away Does patient have siblings?: Yes Number of Siblings: 5 Description of patient's current relationship with siblings: Gets along with them very well. Did patient suffer any verbal/emotional/physical/sexual abuse as a child?: No Did patient suffer from severe childhood neglect?: No Has patient ever been sexually abused/assaulted/raped as an adolescent or adult?: No Was the patient ever a victim of a crime or a disaster?: No Witnessed domestic violence?: No Has patient been effected by domestic violence as an adult?: No  Education:  Highest grade of school patient has completed: High school Currently a student?: No Learning disability?: No  Employment/Work Situation:   Employment situation: Employed Where is patient currently employed?: [employer] How long has patient been employed?: 1 year Patient's job has been impacted by current illness: No What is the longest time patient has a held a job?: 5 years Where was the patient employed at that time?: [employer] Did You Receive Any Psychiatric Treatment/Services While in the 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) Are There Guns or Other Weapons in Your Home?: No  Financial Resources:   Financial resources: Income from employment, Medicaid Does patient have a representative payee or guardian?: No  Alcohol/Substance Abuse:   What has been your use of drugs/alcohol within the last 12 months?: Alcohol occasionally; on Fridays and Saturdays it is the heaviest (1-2 bottles with a friend) Alcohol/Substance Abuse Treatment Hx: Denies past history Has alcohol/substance abuse ever caused legal problems?: No  Social Support System:   Conservation officer, nature Support System: Good Describe Community Support System: Family, friends Type of faith/religion: Believes in God How does patient's faith help to cope with current illness?: does not go to  church  Leisure/Recreation:   Leisure and Hobbies: Spends times with her daughters, reads, writes, visits  a lot of restaurants, blogs  Strengths/Needs:   What is the patient's perception of their strengths?: "I can do whatever I put my mind to.  I am a strong person in everything." Patient states they can use these personal strengths during their treatment to contribute to their recovery: "Everything I do, I do good." Patient states these barriers may affect/interfere with their treatment: None Patient states these barriers may affect their return to the community: None Other important information patient would like considered in planning for their treatment: None  Discharge Plan:   Currently receiving community mental health services: No Patient states concerns and preferences for aftercare planning are: Wants therapy, does not want medicine. Patient states they will know when they are safe and ready for discharge when: Feels ready now Does patient have access to transportation?: Yes Does patient have financial barriers related to discharge medications?: No Will patient be returning to same living situation after discharge?: Yes  Summary/Recommendations:   Summary and Recommendations (to be completed by the evaluator): Pt is a 27yo female admitted under IVC for suicidal statements and actions indicated via text message with husband while in a hotel after leaving her daughters with her sister.  Report is that she has been drinking daily, and not eating properly.  Primary stressors include finding out her husband had cheated, in addition to feelings of guilt for not being able to visit her mother in jail, and ongoing pain/insomnia after back surgery 9 months ago.  She would benefit from group therapy, psychoeducation, medication evaluation, discharge planning, and collateral contact.  At discharge it is recommended that she adhere to her discharge plan.  [name] [name] [name]. 06/15/2018

## 2018-06-15 NOTE — Progress Notes (Signed)
Psychoeducational Group Note  Date:  06/15/2018 Time:  2030 Group Topic/Focus:  wrap up group  Participation Level: Did Not Attend  Participation Quality:  Not Applicable  Affect:  Not Applicable  Cognitive:  Not Applicable  Insight:  Not Applicable  Engagement in Group: Not Applicable  Additional Comments:  Pt was notified that group was beginning but remained in bed.   Marcille BuffyMcNeil, Baird Polinski S 06/15/2018, 9:24 PM

## 2018-06-15 NOTE — Progress Notes (Signed)
[facility] MD Progress Note  06/15/2018 9:31 AM Jennifer Chaney  MRN:  [id number] Subjective:  States she could not sleep well due to some lower back pain ( history of chronic back pain, spinal surgery) . States " I am feeling OK", and currently minimizes/denies depression. Denies suicidal ideations.  Objective: I have reviewed chart notes and have met with patient. 27 year old female, married, three children, self employed, who was admitted under IVC due to reported suicidal ideations, overdose, heavy drinking. States that " none of it was true, I was just trying to get back at my husband" ( reports marital tension due to infidelity) . States  " all I wanted was for him to think how he has hurt me". States " it was just a dumb game, and it landed me here ".  States " I am not depressed, I have a good life and my kids to take care of " . Denies prior psychiatric history. She is not currently on any standing psychiatric medications, states " I am feeling fine, I really don't think I need to be on any". No disruptive or agitated behaviors on unit . Denies any suicidal ideations   Principal Problem:  Suicidal Ideations Diagnosis: Active Problems:   MDD (major depressive disorder), recurrent episode, severe ([place])  Total Time spent with patient: 20 minutes  Past Psychiatric History:   Past Medical History:  Past Medical History:  Diagnosis Date  . Preterm labor    pt-contractions with first pregnancy-del term  . Scoliosis 2014    Past Surgical History:  Procedure Laterality Date  . NO PAST SURGERIES    . POSTERIOR LUMBAR FUSION 4 LEVEL N/A 09/08/2017   Procedure: Thoracic twelve - Lumbar four pedicle screw fixation. Lumbar two laminectomy for burst fracture;  Surgeon: Erline Levine, MD;  Location: [place];  Service: Neurosurgery;  Laterality: N/A;   Family History: History reviewed. No pertinent family history. Family Psychiatric  History:  Social History:  Social History   Substance and  Sexual Activity  Alcohol Use Yes  . Alcohol/week: 1.0 standard drinks  . Types: 1 Glasses of wine per week   Comment: occasional      Social History   Substance and Sexual Activity  Drug Use No    Social History   Socioeconomic History  . Marital status: Married    Spouse name: Not on file  . Number of children: Not on file  . Years of education: Not on file  . Highest education level: Not on file  Occupational History  . Not on file  Social Needs  . Financial resource strain: Patient refused  . Food insecurity:    Worry: Patient refused    Inability: Patient refused  . Transportation needs:    Medical: Patient refused    Non-medical: Patient refused  Tobacco Use  . Smoking status: Former Research scientist (life sciences)  . Smokeless tobacco: Never Used  . Tobacco comment:  smokes hookah some days  Substance and Sexual Activity  . Alcohol use: Yes    Alcohol/week: 1.0 standard drinks    Types: 1 Glasses of wine per week    Comment: occasional   . Drug use: No  . Sexual activity: Yes    Birth control/protection: None  Lifestyle  . Physical activity:    Days per week: Patient refused    Minutes per session: Patient refused  . Stress: Patient refused  Relationships  . Social connections:    Talks on phone: Patient refused  Gets together: Patient refused    Attends religious service: Patient refused    Active member of club or organization: Patient refused    Attends meetings of clubs or organizations: Patient refused    Relationship status: Patient refused  Other Topics Concern  . Not on file  Social History Narrative  . Not on file   Additional Social History:    Pain Medications: See MAR  Prescriptions: See MAR Over the Counter: See MAR History of alcohol / drug use?: Yes Longest period of sobriety (when/how long): unknown Negative Consequences of Use: Personal relationships Name of Substance 1: Alcohol.  1 - Age of First Use: UTA 1 - Amount (size/oz): Pt reported,  drinking liquior, yesterday. Per IVC, pt drinks every day.  1 - Frequency: Daily.  1 - Last Use / Amount: Per IVC, daily.   Sleep: Fair  Appetite:  Fair  Current Medications: Current Facility-Administered Medications  Medication Dose Route Frequency Provider Last Rate Last Dose  . acetaminophen (TYLENOL) tablet 650 mg  650 mg Oral Q6H PRN Sharma Covert, MD   650 mg at 06/15/18 0126  . alum & mag hydroxide-simeth (MAALOX/MYLANTA) 200-200-20 MG/5ML suspension 30 mL  30 mL Oral Q4H PRN Sharma Covert, MD      . chlordiazePOXIDE (LIBRIUM) capsule 25 mg  25 mg Oral TID PRN Sharma Covert, MD   25 mg at 06/15/18 0230  . hydrOXYzine (ATARAX/VISTARIL) tablet 25 mg  25 mg Oral Q4H PRN Sharma Covert, MD   25 mg at 06/15/18 0126  . ibuprofen (ADVIL,MOTRIN) tablet 600 mg  600 mg Oral Q6H PRN Laverle Hobby, PA-C   600 mg at 06/15/18 1219  . magnesium hydroxide (MILK OF MAGNESIA) suspension 30 mL  30 mL Oral Daily PRN Sharma Covert, MD      . traZODone (DESYREL) tablet 50 mg  50 mg Oral QHS PRN Sharma Covert, MD   50 mg at 06/14/18 2057    Lab Results:  Results for orders placed or performed during the hospital encounter of 06/14/18 (from the past 48 hour(s))  Glucose, capillary     Status: None   Collection Time: 06/15/18  2:33 AM  Result Value Ref Range   Glucose-Capillary 77 70 - 99 mg/dL    Blood Alcohol level:  Lab Results  Component Value Date   ETH <10 75/88/3254    Metabolic Disorder Labs: No results found for: HGBA1C, MPG No results found for: PROLACTIN No results found for: CHOL, TRIG, HDL, CHOLHDL, VLDL, LDLCALC  Physical Findings: AIMS: Facial and Oral Movements Muscles of Facial Expression: None, normal Lips and Perioral Area: None, normal Jaw: None, normal Tongue: None, normal,Extremity Movements Upper (arms, wrists, hands, fingers): None, normal Lower (legs, knees, ankles, toes): None, normal, Trunk Movements Neck, shoulders, hips: None,  normal, Overall Severity Severity of abnormal movements (highest score from questions above): None, normal Incapacitation due to abnormal movements: None, normal Patient's awareness of abnormal movements (rate only patient's report): No Awareness, Dental Status Current problems with teeth and/or dentures?: No Does patient usually wear dentures?: No  CIWA:  CIWA-Ar Total: 3 COWS:  COWS Total Score: 1  Musculoskeletal: Strength & Muscle Tone: within normal limits Gait & Station: normal Patient leans: N/A  Psychiatric Specialty Exam: Physical Exam  ROS denies headache, no chest pain, no shortness of breath, no vomiting  Blood pressure 100/70, pulse 100, temperature 98.4 F (36.9 C), temperature source Oral, resp. rate 18, height 5' 7"  (1.702 m),  weight 65.8 kg, SpO2 98 %.Body mass index is 22.71 kg/m.  General Appearance: Well Groomed  Eye Contact:  Good  Speech:  Normal Rate  Volume:  Normal  Mood:  denies feeling depressed, states mood is "OK"  Affect:  Appropriate and reactive  Thought Process:  Linear and Descriptions of Associations: Intact  Orientation:  Full (Time, Place, and Person)  Thought Content:  no hallucinations, no delusions   Suicidal Thoughts:  No denies suicidal ideations, denies self history of self cutting   Homicidal Thoughts:  No denies homicidal or violent ideations and also specifically denies violent or HI ideations towards husband   Memory:  recent and remote grossly intact   Judgement:  Fair  Insight:  Fair  Psychomotor Activity:  Normal  Concentration:  Concentration: Good and Attention Span: Good  Recall:  Good  Fund of Knowledge:  Good  Language:  Good  Akathisia:  Negative  Handed:  Left  AIMS (if indicated):     Assets:  Communication Skills Desire for Improvement Resilience  ADL's:  Intact  Cognition:  WNL  Sleep:  Number of Hours: 5   Assessment -  27 year old female, married, three children, self employed, who was admitted under IVC  due to reported suicidal ideations, overdose, heavy drinking. States that " none of it was true, I was just trying to get back at my husband" ( reports marital tension due to infidelity) . States  " all I wanted was for him to think how he has hurt me". States " it was just a dumb game, and it landed me here ".  States " I am not depressed, I have a good life and my kids to take care of " . Denies prior psychiatric history.  Patient reports she was not feeling suicidal and that statements she made were to " make him think" about their marital issues. She  reports she is currently feeling well, denies depression, denies neuro-vegetative symptoms, denies suicidal ideations, and is focused on getting discharged soon in order to reunite with her family. Not currently on any standing psychiatric medications. Reports chronic back pain.    Treatment Plan Summary: Daily contact with patient to assess and evaluate symptoms and progress in treatment, Medication management, Plan inpatient treatment  and medications as below  Encourage group and milieu participation Continue Vistaril 25 mgrs Q 6 hours PRN for anxiety Continue Trazodone 50 mgrs QHS PRN for insomnia Continue Motrin PRN for back pain as needed  Treatment team to obtain consent/ communicate with family in order to obtain collateral information.  Jenne Campus, MD 06/15/2018, 9:31 AM

## 2018-06-15 NOTE — Progress Notes (Addendum)
D Patient is observed lying on her side in her bed this morning. SHe has the bedcovers pulled to her chin.  SHE says " I can't I hurt" in response to writer asking her if she can open her eyes while writer introduces herslef to the patient. She says " I can't get up..Can you bring me some ginger ale?". Writer served pt cup of ginger ale - in her bed_ and pt remained covered- lying on her side- in the bed. Pt said " I hurt" and said she was using ice packs to help with her back pain- she says she hurts " much of the time" after having repairative surgery for her scoliosis several years ago.      A Pt got OOB approx 2 hrs later, ambulated without diff down the hall and  presented to the nurses station and completed her daily assessment. ON this she wrote she denied SI today, and she rated her depression, hopelessness and anxiety " 0/0/0/", respectively. She laughed out loud several times as she wrote her answers to the assessment questions, saying " I don't take drugs..I didn't take any drugs"  She says " I'm not supposed to be here..I'm going back to sleep". Her affect is flat, blunted,  she wears the same clothes she was sleeping in. She  Refused to speak to Clinical research associatewriter about what she said about assessment quesitons.     R Safety in place.

## 2018-06-16 DIAGNOSIS — R45851 Suicidal ideations: Secondary | ICD-10-CM

## 2018-06-16 MED ORDER — HYDROXYZINE HCL 25 MG PO TABS: 25.0000 mg | ORAL_TABLET | ORAL | 0 refills | Status: DC | PRN

## 2018-06-16 NOTE — Progress Notes (Signed)
Patient has been isolative to her room until she came to request sleep medication from her nurse. She reports being hopeful to discharge on tomorrow. She reports that she is divorcing her husband. Support given and safety maintained on unit with 15 min checks.

## 2018-06-16 NOTE — BHH Group Notes (Signed)
[facility] LCSW Group Therapy Note  06/16/2018  10:00-11:00AM  Type of Therapy and Topic:  Group Therapy:  Adding Supports Including Being Your Own Support  Participation Level:  Did Not Attend   Description of Group:  Patients in this group were introduced to the concept that additional supports including self-support are an essential part of recovery.  A song entitled "I Need Help!" was played and a group discussion was held in reaction to the idea of needing to add supports.  A song entitled "My Own Hero" was played and a group discussion ensued in which patients stated they could relate to the song and it inspired them to realize they have be willing to help themselves in order to succeed, because other people cannot achieve sobriety or stability for them.  We discussed adding a variety of healthy supports to address the various needs in their lives.  A song was played called "I Know Where I've Been" toward the end of group and used to conduct an inspirational wrap-up to group of remembering how far they have already come in their journey.  Therapeutic Goals: 1)  demonstrate the importance of being a part of one's own support system 2)  discuss reasons people in one's life may eventually be unable to be continually supportive  3)  identify the patient's current support system and   4)  elicit commitments to add healthy supports and to become more conscious of being self-supportive   Summary of Patient Progress:  N/A   Therapeutic Modalities:   Motivational Interviewing Activity  Colleene Swarthout [name]       

## 2018-06-16 NOTE — Progress Notes (Signed)
  [facility]  Will you be returning to the same living situation after discharge:  Yes,  with family At discharge, do you have transportation home?: Yes,  family Do you have the ability to pay for your medications: Yes,  income and insurance  Release of information consent forms completed and turned in to Medical Records by CSW.   Patient to Follow up at: Follow-up Information    [facility] Follow up.   Specialty:  Behavioral Health Why:  You have expressed an interest in therapy.  A social worker will call you Monday with an appointment date/time for an intake appointment to begin counseling. Contact information: [address] [place] [id number] [phone]           Next level of care provider [facility] Link:no  Safety Planning and Suicide Prevention discussed: Yes,  with sister  Have you used any form of tobacco in the last 30 days? (Cigarettes, Smokeless Tobacco, Cigars, and/or Pipes): No  Has patient been referred to the Quitline?: N/A patient is not a smoker  Patient has been referred for addiction treatment: Yes  [name] [name] [name], LCSW 06/16/2018, 9:13 AM

## 2018-06-16 NOTE — BHH Counselor (Signed)
Clinical Social Work Note  Once again attempted to call husband Guillermina CityYranol Navarro, who had returned CSW's call yesterday evening.  Left a HIPAA-compliant voicemail stating that Suicide Prevention Education would be in patient's discharge paperwork.  [name], LCSW 06/16/2018, 9:10 AM

## 2018-06-16 NOTE — [facility] Suicide Risk Assessment (Signed)
[facility]   Principal Problem: Suicidal Ideations Discharge Diagnoses: Active Problems:   MDD (major depressive disorder), recurrent episode, severe (HCC)   Total Time spent with patient: 30 minutes  Musculoskeletal: Strength & Muscle Tone: within normal limits Gait & Station: normal Patient leans: N/A  Psychiatric Specialty Exam: ROS no headache, no chest pain, no shortness of breath, no vomiting   Blood pressure 102/62, pulse 66, temperature 98.2 F (36.8 C), temperature source Oral, resp. rate 16, height 5\' 7"  (1.702 m), weight 65.8 kg, SpO2 98 %.Body mass index is 22.71 kg/m.  General Appearance: Well Groomed  Eye Contact::  Good  Speech:  Normal Rate409  Volume:  Normal  Mood:  reports " I am feeling good, normal". Denies depression and presents euthymic  Affect:  Appropriate and reactive  Thought Process:  Linear and Descriptions of Associations: Intact  Orientation:  Full (Time, Place, and Person)  Thought Content:  no hallucinations, no delusions, not internally preoccupied   Suicidal Thoughts:  No denies suicidal or self injurious ideations , denies homicidal or violent ideations, and specifically denies homicidal or violent ideations towards her husband  Homicidal Thoughts:  No  Memory:  recent and remote grossly intact   Judgement:  Other:  improving   Insight:  fair- improving   Psychomotor Activity:  Normal  Concentration:  Good  Recall:  Good  Fund of Knowledge:Good  Language: Good  Akathisia:  Negative  Handed:  Right  AIMS (if indicated):     Assets:  Communication Skills Desire for Improvement Resilience  Sleep:  Number of Hours: 4.75  Cognition: WNL  ADL's:  Intact   Mental Status Per Nursing Assessment::   On Admission:  NA  Demographic Factors:  27, married, three daughters , self employed .  Loss Factors: Marital conflict  Historical Factors: No prior psychiatric admissions, no history of suicide  attempts  Risk Reduction Factors:   Responsible for children under 818 years of age, Sense of responsibility to family, Employed, Positive social support and Positive coping skills or problem solving skills  Continued Clinical Symptoms:  At this time patient is alert, attentive, calm, denies depression and presents euthymic, with a full range of affect, no thought disorder, no suicidal or self injurious ideations, no homicidal or violent ideations, and specifically also denies any HI towards husband, no hallucinations, no delusions, not internally preoccupied, future oriented, states looking forward to reuniting with her children, taking them out to a restaurant . Identifies her love for her children as a protective factor against ever hurting self, states " they are always my priority". With her express consent I spoke with her sister, who visited her yesterday evening and who confirms patient seems back to her normal self , improved, and who agrees with discharge planning . Patient reports she has a very supportive family . Behavior on unit in good control, pleasant on approach.  She is not currently on any standing psychiatric medication.   Cognitive Features That Contribute To Risk:  No gross cognitive deficits noted upon discharge. Is alert , attentive, and oriented x 3   Suicide Risk:  Mild:  Suicidal ideation of limited frequency, intensity, duration, and specificity.  There are no identifiable plans, no associated intent, mild dysphoria and related symptoms, good self-control (both objective and subjective assessment), few other risk factors, and identifiable protective factors, including available and accessible social support.    Plan Of Care/Follow-up recommendations:  Activity:  as tolerated  Diet:  regular Tests:  NA Other:  See below  Patient is expressing readiness for discharge and there are no ongoing grounds for involuntary commitment She is discharging in good  spirits Plans to return home Agrees to referral for individual psychotherapy. She is not on psychiatric medications at this time.  Craige Cotta, MD 06/16/2018, 8:17 AM

## 2018-06-16 NOTE — Progress Notes (Signed)
Nursing discharge note: Patient discharged home per MD order.  Patient received all personal belongings from unit and locker.  Reviewed AVS/transition record with patient and she indicates understanding.  Patient will follow up with [facility].  Patient denies any thoughts of self harm.  She left ambulatory and called an [employer] for transportation.

## 2018-06-16 NOTE — Discharge Summary (Addendum)
Physician Discharge Summary Note  Patient:  Jennifer Chaney is an 27 y.o., female MRN:  [id number] DOB:  02-19-1991 Patient phone:  [phone] (home)  Patient address:   [address] [place] [address],  Total Time spent with patient: 15 minutes  Date of Admission:  06/14/2018 Date of Discharge: 06/16/2018  Reason for Admission: Per assessment note on admission:Patient is a 27 year old Hispanic female who presented to the [facility] emergency department on 06/14/2018 under involuntary commitment. The patient stated that she found out in March that her husband was having an affair. She stated since then things had not been going well. They had decided to work things out, but they had an argument earlier this week and the husband left the home. She stated yesterday that the husband's girlfriend contacted her by telephone and told her that the husband was staying with her at the time. She stated she decided at that point to "get back at him". She went to a hotel parking lot, and apparently took a picture of pills. She also communicated to him that she was a failure as a mother, and that everything was bad. Patient also reported she was unable to visit her mother in jail. She also reportedly told people in the emergency room that her father calls her "a bad daughter". She reportedly took pills approximately around 4 PM. The patient denied having taking the pills, she also reported earlier that she was drinking 2 bottles of wine, but now she denies that as well. She stated she is "not crazy". She had been involuntarily committed by her husband. The husband stated in the IVC paperwork that attacks was sent to him stating she was going to kill herself, and then send a picture with pills in her hand. According to the husband's involuntary commitment paperwork the respondent was drinking every day. The patient also was reportedly stating she was going to kill herself  and that her husband would be responsible. She was reportedly not eating or drinking. The patient denied every day alcohol use, and stated that she would drink a bottle of wine over 7 days. Her drug screen was negative, her blood alcohol was negative. She denied any previous psychiatric admissions. She denied any previous psychiatric treatment. She was involuntarily committed and sent to the hospital for evaluation and stabilization.  Principal Problem: MDD (major depressive disorder), recurrent episode, severe (HCC) Discharge Diagnoses: Principal Problem:   MDD (major depressive disorder), recurrent episode, severe (HCC) Active Problems:   Suicidal ideations   Past Psychiatric History:Patient denied any previous psychiatric admissions, she denied any previous psychiatric treatment, she denied any previous suicidal ideation.  Past Medical History:  Past Medical History:  Diagnosis Date  . Preterm labor    pt-contractions with first pregnancy-del term  . Scoliosis 2014    Past Surgical History:  Procedure Laterality Date  . NO PAST SURGERIES    . POSTERIOR LUMBAR FUSION 4 LEVEL N/A 09/08/2017   Procedure: Thoracic twelve - Lumbar four pedicle screw fixation. Lumbar two laminectomy for burst fracture;  Surgeon: Maeola Harman, MD;  [facility] OR;  Service: Neurosurgery;  Laterality: N/A;   Family History: History reviewed. No pertinent family history. Family Psychiatric  History:  Social History:  Social History   Substance and Sexual Activity  Alcohol Use Yes  . Alcohol/week: 1.0 standard drinks  . Types: 1 Glasses of wine per week   Comment: occasional      Social History   Substance and Sexual  Activity  Drug Use No    Social History   Socioeconomic History  . Marital status: Married    Spouse name: Not on file  . Number of children: Not on file  . Years of education: Not on file  . Highest education level: Not on file  Occupational History  . Not on file   Social Needs  . Financial resource strain: Patient refused  . Food insecurity:    Worry: Patient refused    Inability: Patient refused  . Transportation needs:    Medical: Patient refused    Non-medical: Patient refused  Tobacco Use  . Smoking status: Former Games developer  . Smokeless tobacco: Never Used  . Tobacco comment:  smokes hookah some days  Substance and Sexual Activity  . Alcohol use: Yes    Alcohol/week: 1.0 standard drinks    Types: 1 Glasses of wine per week    Comment: occasional   . Drug use: No  . Sexual activity: Yes    Birth control/protection: None  Lifestyle  . Physical activity:    Days per week: Patient refused    Minutes per session: Patient refused  . Stress: Patient refused  Relationships  . Social connections:    Talks on phone: Patient refused    Gets together: Patient refused    Attends religious service: Patient refused    Active member of club or organization: Patient refused    Attends meetings of clubs or organizations: Patient refused    Relationship status: Patient refused  Other Topics Concern  . Not on file  Social History Narrative  . Not on file    Hospital Course:  Jalani Cullifer was admitted for MDD (major depressive disorder), recurrent episode, severe (HCC) , with psychosis and crisis management.  Pt was treated discharged with the medications listed below under Medication List.  Medical problems were identified and treated as needed.  Home medications were restarted as appropriate.  Improvement was monitored by observation and Rebecka Apley 's daily report of symptom reduction.  Emotional and mental status was monitored by daily self-inventory reports completed by Rebecka Apley and clinical staff.         Rebecka Apley was evaluated by the treatment team for stability and plans for continued recovery upon discharge. Rebecka Apley 's motivation was an integral factor for scheduling further treatment. Employment,  transportation, bed availability, health status, family support, and any pending legal issues were also considered during hospital stay. Pt was offered further treatment options upon discharge including but not limited to Residential, Intensive Outpatient, and Outpatient treatment.  Rebecka Apley will follow up with the services as listed below under Follow Up Information.     Upon completion of this admission the patient was both mentally and medically stable for discharge denying suicidal/homicidal ideation, auditory/visual/tactile hallucinations, delusional thoughts and paranoia.     Venora Sonntag responded well to treatment with daily group session and individual therapy. Patient provided with PRN Vistaril 25 mg for reports anxity.  Pt demonstrated improvement without reported or observed adverse effects to the point of stability appropriate for outpatient management. Pertinent labs include: for which outpatient follow-up is necessary for lab recheck as mentioned below. Reviewed CBC, CMP, BAL, and UDS; all unremarkable aside from noted exceptions.   Physical Findings: AIMS: Facial and Oral Movements Muscles of Facial Expression: None, normal Lips and Perioral Area: None, normal Jaw: None, normal Tongue: None, normal,Extremity Movements Upper (arms, wrists, hands, fingers): None, normal Lower (legs, knees, ankles, toes): None, normal,  Trunk Movements Neck, shoulders, hips: None, normal, Overall Severity Severity of abnormal movements (highest score from questions above): None, normal Incapacitation due to abnormal movements: None, normal Patient's awareness of abnormal movements (rate only patient's report): No Awareness, Dental Status Current problems with teeth and/or dentures?: No Does patient usually wear dentures?: No  CIWA:  CIWA-Ar Total: 3 COWS:  COWS Total Score: 1  Musculoskeletal: Strength & Muscle Tone: within normal limits Gait & Station: normal Patient leans:  N/A  Psychiatric Specialty Exam: See SRA by MD Physical Exam  Vitals reviewed. Constitutional: She appears well-developed.  Psychiatric: She has a normal mood and affect. Her behavior is normal.    Review of Systems  Psychiatric/Behavioral: Negative for depression and suicidal ideas. The patient is not nervous/anxious (improving ).   All other systems reviewed and are negative.   Blood pressure 102/62, pulse 66, temperature 98.2 F (36.8 C), temperature source Oral, resp. rate 16, height 5\' 7"  (1.702 m), weight 65.8 kg, SpO2 98 %.Body mass index is 22.71 kg/m.   Have you used any form of tobacco in the last 30 days? (Cigarettes, Smokeless Tobacco, Cigars, and/or Pipes): No  Has this patient used any form of tobacco in the last 30 days? (Cigarettes, Smokeless Tobacco, Cigars, and/or Pipes) Yes, No  Blood Alcohol level:  Lab Results  Component Value Date   ETH <10 06/13/2018    Metabolic Disorder Labs:  No results found for: HGBA1C, MPG No results found for: PROLACTIN No results found for: CHOL, TRIG, HDL, CHOLHDL, VLDL, LDLCALC  See Psychiatric Specialty Exam and Suicide Risk Assessment completed by Attending Physician prior to discharge.  Discharge destination:  Home  Is patient on multiple antipsychotic therapies at discharge:  No   Has Patient had three or more failed trials of antipsychotic monotherapy by history:  No  Recommended Plan for Multiple Antipsychotic Therapies: NA  Discharge Instructions    Diet - low sodium heart healthy   Complete by:  As directed    Discharge instructions   Complete by:  As directed    Take all medications as prescribed. Keep all follow-up appointments as scheduled.  Do not consume alcohol or use illegal drugs while on prescription medications. Report any adverse effects from your medications to your primary care provider promptly.  In the event of recurrent symptoms or worsening symptoms, call 911, a crisis hotline, or go to the  nearest emergency department for evaluation.   Increase activity slowly   Complete by:  As directed      Allergies as of 06/16/2018      Reactions   Penicillins Other (See Comments)   Reaction unknown Has patient had a PCN reaction causing immediate rash, facial/tongue/throat swelling, SOB or lightheadedness with hypotension: unknown Has patient had a PCN reaction causing severe rash involving mucus membranes or skin necrosis: unknown Has patient had a PCN reaction that required hospitalization: yes Has patient had a PCN reaction occurring within the last 10 years: yes - when I was in labor at the hospital If all of the above answers are "NO", then may proceed with       Medication List    STOP taking these medications   acetaminophen 500 MG tablet Commonly known as:  TYLENOL   ibuprofen 200 MG tablet Commonly known as:  ADVIL,MOTRIN   levofloxacin 500 MG tablet Commonly known as:  LEVAQUIN   methocarbamol 500 MG tablet Commonly known as:  ROBAXIN   ondansetron 4 MG tablet Commonly known as:  Kimberly-ClarkOFRAN  oxyCODONE 5 MG immediate release tablet Commonly known as:  Oxy IR/ROXICODONE     TAKE these medications     Indication  hydrOXYzine 25 MG tablet Commonly known as:  ATARAX/VISTARIL Take 1 tablet (25 mg total) by mouth every 4 (four) hours as needed for anxiety, nausea or vomiting.  Indication:  Feeling Anxious      Follow-up Information    [facility] Follow up.   Specialty:  Behavioral Health Why:  You have expressed an interest in therapy.  A social worker will call you Monday with an appointment date/time for an intake appointment to begin counseling. Contact information: [address] [place] [address] [phone]           Follow-up recommendations:  Activity:  as tolerated Diet:  heart healthy  Comments:  Take all medications as prescribed. Keep all follow-up appointments as scheduled.  Do not  consume alcohol or use illegal drugs while on prescription medications. Report any adverse effects from your medications to your primary care provider promptly.  In the event of recurrent symptoms or worsening symptoms, call 911, a crisis hotline, or go to the nearest emergency department for evaluation.   Signed: Oneta Rack, NP 06/16/2018, 9:46 AM   Patient seen, Suicide Assessment Completed.  Disposition Plan Reviewed

## 2018-06-16 NOTE — Plan of Care (Signed)
D: Patient discharging today.  She requested her phone out of her locker. The phone was dead, and Engineer, materialssecurity officer offered to charge it.  Patient received the phone so she could call her ride.  She took down the number and attempted to call.  She states, "she won't pick up the phone."  The phone had been taken back to her locker and locked securely.  Patient asked for a cab voucher. Informed SW.  Patient wanted her phone again and was told that we are not going back into the locker.  A: Continue to monitor medication management and MD orders.  Safety checks completed every 15 minutes per protocol.  Offer support and encouragement as needed.  R: Patient is receptive to staff; her behavior is appropriate.    Problem: Education: Goal: Knowledge of [facility] Education information/materials will improve Outcome: Adequate for Discharge Goal: Emotional status will improve Outcome: Adequate for Discharge Goal: Mental status will improve Outcome: Adequate for Discharge Goal: Verbalization of understanding the information provided will improve Outcome: Adequate for Discharge   Problem: Activity: Goal: Interest or engagement in activities will improve Outcome: Adequate for Discharge Goal: Sleeping patterns will improve Outcome: Adequate for Discharge   Problem: Coping: Goal: Ability to verbalize frustrations and anger appropriately will improve Outcome: Adequate for Discharge Goal: Ability to demonstrate self-control will improve Outcome: Adequate for Discharge   Problem: Health Behavior/Discharge Planning: Goal: Identification of resources available to assist in meeting health care needs will improve Outcome: Adequate for Discharge Goal: Compliance with treatment plan for underlying cause of condition will improve Outcome: Adequate for Discharge   Problem: Physical Regulation: Goal: Ability to maintain clinical measurements within normal limits will improve Outcome: Adequate for  Discharge   Problem: Safety: Goal: Periods of time without injury will increase Outcome: Adequate for Discharge   Problem: Education: Goal: Ability to make informed decisions regarding treatment will improve Outcome: Adequate for Discharge   Problem: Coping: Goal: Coping ability will improve Outcome: Adequate for Discharge   Problem: Health Behavior/Discharge Planning: Goal: Identification of resources available to assist in meeting health care needs will improve Outcome: Adequate for Discharge   Problem: Medication: Goal: Compliance with prescribed medication regimen will improve Outcome: Adequate for Discharge   Problem: Self-Concept: Goal: Ability to disclose and discuss suicidal ideas will improve Outcome: Adequate for Discharge Goal: Will verbalize positive feelings about self Outcome: Adequate for Discharge   Problem: Education: Goal: Utilization of techniques to improve thought processes will improve Outcome: Adequate for Discharge Goal: Knowledge of the prescribed therapeutic regimen will improve Outcome: Adequate for Discharge   Problem: Activity: Goal: Interest or engagement in leisure activities will improve Outcome: Adequate for Discharge Goal: Imbalance in normal sleep/wake cycle will improve Outcome: Adequate for Discharge   Problem: Coping: Goal: Coping ability will improve Outcome: Adequate for Discharge Goal: Will verbalize feelings Outcome: Adequate for Discharge   Problem: Health Behavior/Discharge Planning: Goal: Ability to make decisions will improve Outcome: Adequate for Discharge Goal: Compliance with therapeutic regimen will improve Outcome: Adequate for Discharge

## 2018-10-23 ENCOUNTER — Encounter (HOSPITAL_COMMUNITY): Payer: Self-pay

## 2018-10-23 ENCOUNTER — Other Ambulatory Visit: Payer: Self-pay

## 2018-10-23 ENCOUNTER — Emergency Department (HOSPITAL_COMMUNITY)
Admission: EM | Admit: 2018-10-23 | Discharge: 2018-10-23 | Disposition: A | Discharge status: Home / Self Care | Payer: Medicaid Other | Attending: Emergency Medicine | Admitting: Emergency Medicine

## 2018-10-23 DIAGNOSIS — B029 Zoster without complications: Secondary | ICD-10-CM | POA: Insufficient documentation

## 2018-10-23 DIAGNOSIS — Z87891 Personal history of nicotine dependence: Secondary | ICD-10-CM | POA: Diagnosis not present

## 2018-10-23 DIAGNOSIS — R21 Rash and other nonspecific skin eruption: Secondary | ICD-10-CM | POA: Diagnosis present

## 2018-10-23 MED ORDER — VALACYCLOVIR HCL 1 G PO TABS: 1000.0000 mg | ORAL_TABLET | Freq: Three times a day (TID) | ORAL | 0 refills | Status: AC

## 2018-10-23 MED ORDER — HYDROCODONE-ACETAMINOPHEN 5-325 MG PO TABS: 1.0000 | ORAL_TABLET | Freq: Four times a day (QID) | ORAL | 0 refills | Status: AC | PRN

## 2018-10-23 NOTE — ED Notes (Signed)
Patient verbalizes understanding of discharge instructions. Opportunity for questioning and answers were provided. Armband removed by staff, pt discharged from ED. Pt ambulatory to lobby. Prescriptions and pharmacy reviewed. 

## 2018-10-23 NOTE — ED Provider Notes (Signed)
[facility]   CSN: [id number] Arrival date & time: 10/23/18  2104    History   Chief Complaint Chief Complaint  Patient presents with  . Rash    HPI Jennifer Chaney is a 28 y.o. female.     Patient presents to the emergency department with a chief complaint of rash.  She states that she noticed the rash on her right low back front of her right leg.  She states that it is been present for approximately 3 days.  She reports pain and burning, but denies any itching or discharge.  Denies any fevers or chills.  Denies any numbness, weakness, or tingling.  Denies any successful treatments prior to arrival.  She did have chickenpox as a child.  The history is provided by the patient. No language interpreter was used.    Past Medical History:  Diagnosis Date  . Preterm labor    pt-contractions with first pregnancy-del term  . Scoliosis 2014    Patient Active Problem List   Diagnosis Date Noted  . Suicidal ideations   . MDD (major depressive disorder), recurrent episode, severe (HCC) 06/14/2018  . Accidental injury   . L2 Lumbar burst fracture  09/07/2017  . Fall from building 09/07/2017  . Lumbar transverse process fracture (HCC) 09/07/2017  . Lumbar burst fracture, closed, initial encounter (HCC) 09/07/2017  . Closed two column fracture of lumbar vertebra (HCC) 09/07/2017  . Contraception management 02/06/2014  . Bone and joint disorders of maternal back, pelvis, and lower limbs, antepartum(648.73) 06/24/2013  . Scoliosis 06/12/2013    Past Surgical History:  Procedure Laterality Date  . NO PAST SURGERIES    . POSTERIOR LUMBAR FUSION 4 LEVEL N/A 09/08/2017   Procedure: Thoracic twelve - Lumbar four pedicle screw fixation. Lumbar two laminectomy for burst fracture;  Surgeon: Maeola HarmanStern, Joseph, MD;  [facility];  Service: Neurosurgery;  Laterality: N/A;     OB History    Gravida  4   Para  3   Term  3   Preterm      AB  1   Living  3     SAB  1   TAB      Ectopic      Multiple      Live Births  3            Home Medications    Prior to Admission medications   Medication Sig Start Date End Date Taking? Authorizing Provider  HYDROcodone-acetaminophen (NORCO/VICODIN) 5-325 MG tablet Take 1-2 tablets by mouth every 6 (six) hours as needed for up to 10 days. 10/23/18 11/02/18  Roxy HorsemanBrowning, Schyler Counsell, PA-C  hydrOXYzine (ATARAX/VISTARIL) 25 MG tablet Take 1 tablet (25 mg total) by mouth every 4 (four) hours as needed for anxiety, nausea or vomiting. 06/16/18   Oneta RackLewis, Tanika N, NP  valACYclovir (VALTREX) 1000 MG tablet Take 1 tablet (1,000 mg total) by mouth 3 (three) times daily for 7 days. 10/23/18 10/30/18  Roxy HorsemanBrowning, Fermon Ureta, PA-C    Family History No family history on file.  Social History Social History   Tobacco Use  . Smoking status: Former Games developermoker  . Smokeless tobacco: Never Used  . Tobacco comment:  smokes hookah some days  Substance Use Topics  . Alcohol use: Yes    Alcohol/week: 1.0 standard drinks    Types: 1 Glasses of wine per week    Comment: occasional   . Drug use: No     Allergies  Penicillins   Review of Systems Review of Systems  All other systems reviewed and are negative.    Physical Exam Updated Vital Signs BP 112/70   Pulse 73   Temp 98.2 F (36.8 C) (Oral)   Resp 14   LMP 09/27/2018   SpO2 100%   Physical Exam Vitals signs and nursing note reviewed.  Constitutional:      General: She is not in acute distress.    Appearance: She is not diaphoretic.  HENT:     Head: Normocephalic and atraumatic.  Eyes:     Conjunctiva/sclera: Conjunctivae normal.     Pupils: Pupils are equal, round, and reactive to light.  Neck:     Trachea: No tracheal deviation.  Cardiovascular:     Rate and Rhythm: Normal rate.  Pulmonary:     Effort: Pulmonary effort is normal. No respiratory distress.  Abdominal:     Palpations: Abdomen is soft.  Musculoskeletal:  Normal range of motion.  Skin:    General: Skin is warm and dry.     Comments: Vesicular rash on right lower back extending onto the right anterior thigh consistent with shingles  Neurological:     Mental Status: She is alert and oriented to person, place, and time.  Psychiatric:        Judgment: Judgment normal.      ED Treatments / Results  Labs (all labs ordered are listed, but only abnormal results are displayed) Labs Reviewed - No data to display  EKG None  Radiology No results found.  Procedures Procedures (including critical care time)  Medications Ordered in ED Medications - No data to display   Initial Impression / Assessment and Plan / ED Course  I have reviewed the triage vital signs and the nursing notes.  Pertinent labs & imaging results that were available during my care of the patient were reviewed by me and considered in my medical decision making (see chart for details).        Patient with shingles.  Onset was within 3 days.  Will treat with Valtrex and pain medicine.  Vital signs are stable.  Patient has no systemic symptoms.  We will discharged home.  Instructions given to avoid at risk populations.  Final Clinical Impressions(s) / ED Diagnoses   Final diagnoses:  Herpes zoster without complication    ED Discharge Orders         Ordered    HYDROcodone-acetaminophen (NORCO/VICODIN) 5-325 MG tablet  Every 6 hours PRN     10/23/18 2243    valACYclovir (VALTREX) 1000 MG tablet  3 times daily     10/23/18 2243           Roxy Horseman, PA-C 10/23/18 2246    Shaune Pollack, MD 10/24/18 1446

## 2018-10-23 NOTE — ED Triage Notes (Signed)
Pt reports rash to her right thigh and the right side of her lower back for the past 3 days, no itching just burning pain.

## 2019-02-06 ENCOUNTER — Encounter (HOSPITAL_COMMUNITY): Payer: Self-pay | Admitting: *Deleted

## 2019-02-06 ENCOUNTER — Inpatient Hospital Stay (HOSPITAL_COMMUNITY): Payer: Medicaid Other

## 2019-02-06 ENCOUNTER — Other Ambulatory Visit: Payer: Self-pay

## 2019-02-06 ENCOUNTER — Inpatient Hospital Stay (HOSPITAL_COMMUNITY)
Admission: AD | Admit: 2019-02-06 | Discharge: 2019-02-06 | Disposition: A | Discharge status: Home / Self Care | Payer: Medicaid Other | Attending: Obstetrics & Gynecology | Admitting: Obstetrics & Gynecology

## 2019-02-06 DIAGNOSIS — Z880 Allergy status to penicillin: Secondary | ICD-10-CM | POA: Insufficient documentation

## 2019-02-06 DIAGNOSIS — O26891 Other specified pregnancy related conditions, first trimester: Secondary | ICD-10-CM | POA: Diagnosis not present

## 2019-02-06 DIAGNOSIS — R109 Unspecified abdominal pain: Secondary | ICD-10-CM

## 2019-02-06 DIAGNOSIS — B9689 Other specified bacterial agents as the cause of diseases classified elsewhere: Secondary | ICD-10-CM | POA: Diagnosis not present

## 2019-02-06 DIAGNOSIS — O23591 Infection of other part of genital tract in pregnancy, first trimester: Secondary | ICD-10-CM | POA: Diagnosis not present

## 2019-02-06 DIAGNOSIS — Z87891 Personal history of nicotine dependence: Secondary | ICD-10-CM | POA: Diagnosis not present

## 2019-02-06 DIAGNOSIS — O209 Hemorrhage in early pregnancy, unspecified: Secondary | ICD-10-CM | POA: Diagnosis not present

## 2019-02-06 DIAGNOSIS — O368310 Maternal care for abnormalities of the fetal heart rate or rhythm, first trimester, not applicable or unspecified: Secondary | ICD-10-CM | POA: Diagnosis not present

## 2019-02-06 DIAGNOSIS — O4690 Antepartum hemorrhage, unspecified, unspecified trimester: Secondary | ICD-10-CM

## 2019-02-06 DIAGNOSIS — Z3A01 Less than 8 weeks gestation of pregnancy: Secondary | ICD-10-CM | POA: Insufficient documentation

## 2019-02-06 HISTORY — DX: Unspecified infectious disease: B99.9

## 2019-02-06 HISTORY — DX: Encounter for other specified aftercare: Z51.89

## 2019-02-06 HISTORY — DX: Headache, unspecified: R51.9

## 2019-02-06 LAB — URINALYSIS, ROUTINE W REFLEX MICROSCOPIC
Bacteria, UA: NONE SEEN
Bilirubin Urine: NEGATIVE
Glucose, UA: NEGATIVE mg/dL
Ketones, ur: NEGATIVE mg/dL
Leukocytes,Ua: NEGATIVE
Nitrite: NEGATIVE
Protein, ur: NEGATIVE mg/dL
Specific Gravity, Urine: 1.008 (ref 1.005–1.030)
pH: 7 (ref 5.0–8.0)

## 2019-02-06 LAB — CBC
HCT: 42.1 % (ref 36.0–46.0)
Hemoglobin: 14.6 g/dL (ref 12.0–15.0)
MCH: 32.9 pg (ref 26.0–34.0)
MCHC: 34.7 g/dL (ref 30.0–36.0)
MCV: 94.8 fL (ref 80.0–100.0)
Platelets: 237 10*3/uL (ref 150–400)
RBC: 4.44 MIL/uL (ref 3.87–5.11)
RDW: 11.5 % (ref 11.5–15.5)
WBC: 8.4 10*3/uL (ref 4.0–10.5)
nRBC: 0 % (ref 0.0–0.2)

## 2019-02-06 LAB — WET PREP, GENITAL
Sperm: NONE SEEN
Trich, Wet Prep: NONE SEEN
Yeast Wet Prep HPF POC: NONE SEEN

## 2019-02-06 LAB — HCG, QUANTITATIVE, PREGNANCY: hCG, Beta Chain, Quant, S: 9412 m[IU]/mL — ABNORMAL HIGH (ref <5)

## 2019-02-06 LAB — POCT PREGNANCY, URINE: Preg Test, Ur: POSITIVE — AB

## 2019-02-06 MED ORDER — METRONIDAZOLE 0.75 % VA GEL: 1.0000 | Freq: Every day | VAGINAL | 0 refills | Status: AC

## 2019-02-06 NOTE — MAU Note (Signed)
Not really having pain now, still bleeding - bright red, mainly when she wipes.

## 2019-02-06 NOTE — MAU Provider Note (Addendum)
History     CSN: [id number]  Arrival date and time: 02/06/19 [id number]   First Provider Initiated Contact with Patient 02/06/19 2039      Chief Complaint  Patient presents with  . Vaginal Bleeding   Jennifer Chaney is a 28 y.o. [id number] at [id number] by US today.  She presents today for Vaginal Bleeding.  She states the bleeding started this morning, was light red, and only with wiping.  She states it increased throughout the day and is now as light as a menstrual period and is dark red. Patient states she was experiencing cramping pain yesterday and took tylenol with some relief.  She denies recent sexual activity and has no other complaints.     OB History    Gravida  5   Para  3   Term  3   Preterm      AB  1   Living  3     SAB  1   TAB      Ectopic      Multiple      Live Births  3        Obstetric Comments  Required blood transfusion after first delivery        Past Medical History:  Diagnosis Date  . Blood transfusion without reported diagnosis    first preg  . Headache   . Infection    UTI  . Preterm labor    pt-contractions with first pregnancy-del term  . Scoliosis 2014    Past Surgical History:  Procedure Laterality Date  . BREAST SURGERY     implants  . POSTERIOR LUMBAR FUSION 4 LEVEL N/A 09/08/2017   Procedure: Thoracic twelve - Lumbar four pedicle screw fixation. Lumbar two laminectomy for burst fracture;  Surgeon: Maeola HarmanStern, Joseph, MD;  [facility];  Service: Neurosurgery;  Laterality: N/A;    Family History  Problem Relation Age of Onset  . Healthy Mother   . Healthy Father     Social History   Tobacco Use  . Smoking status: Former Games developermoker  . Smokeless tobacco: Never Used  . Tobacco comment:  smokes hookah some days  Substance Use Topics  . Alcohol use: Not Currently    Alcohol/week: 1.0 standard drinks    Types: 1 Glasses of wine per week    Comment: occasional   . Drug use: No    Allergies:  Allergies  Allergen  Reactions  . Penicillins Other (See Comments)    Reaction unknown Has patient had a PCN reaction causing immediate rash, facial/tongue/throat swelling, SOB or lightheadedness with hypotension: unknown Has patient had a PCN reaction causing severe rash involving mucus membranes or skin necrosis: unknown Has patient had a PCN reaction that required hospitalization: yes Has patient had a PCN reaction occurring within the last 10 years: yes - when I was in labor at the hospital If all of the above answers are "NO", then may proceed with     Medications Prior to Admission  Medication Sig Dispense Refill Last Dose  . hydrOXYzine (ATARAX/VISTARIL) 25 MG tablet Take 1 tablet (25 mg total) by mouth every 4 (four) hours as needed for anxiety, nausea or vomiting. 30 tablet 0     Review of Systems  Constitutional: Negative for chills and fever.  Respiratory: Negative for cough and shortness of breath.   Gastrointestinal: Negative for abdominal pain, constipation, diarrhea, nausea and vomiting.  Genitourinary: Positive for vaginal bleeding. Negative for difficulty urinating, dysuria and vaginal discharge.  Musculoskeletal:  Negative for back pain.  Neurological: Positive for headaches ("Normal everyday thing"). Negative for dizziness and light-headedness.   Physical Exam   Blood pressure 117/64, pulse 81, temperature 99.2 F (37.3 C), temperature source Oral, resp. rate 16, last menstrual period 11/29/2018, SpO2 100 %.  Physical Exam  Constitutional: She is oriented to person, place, and time. She appears well-developed and well-nourished. No distress.  HENT:  Head: Normocephalic and atraumatic.  Eyes: Conjunctivae are normal.  Neck: Normal range of motion.  Cardiovascular: Normal rate, regular rhythm and normal heart sounds.  Respiratory: Effort normal and breath sounds normal.  GI: Soft. Bowel sounds are normal. There is abdominal tenderness in the right lower quadrant.  Genitourinary:     Genitourinary Comments: Pelvic Exam Declined by Patient   Musculoskeletal: Normal range of motion.  Neurological: She is alert and oriented to person, place, and time.  Skin: Skin is warm and dry.  Psychiatric: She has a normal mood and affect. Her behavior is normal.    MAU Course  Procedures Results for orders placed or performed during the hospital encounter of 02/06/19 (from the past 24 hour(s))  Urinalysis, Routine w reflex microscopic     Status: Abnormal   Collection Time: 02/06/19  6:11 PM  Result Value Ref Range   Color, Urine STRAW (A) YELLOW   APPearance CLEAR CLEAR   Specific Gravity, Urine 1.008 1.005 - 1.030   pH 7.0 5.0 - 8.0   Glucose, UA NEGATIVE NEGATIVE mg/dL   Hgb urine dipstick MODERATE (A) NEGATIVE   Bilirubin Urine NEGATIVE NEGATIVE   Ketones, ur NEGATIVE NEGATIVE mg/dL   Protein, ur NEGATIVE NEGATIVE mg/dL   Nitrite NEGATIVE NEGATIVE   Leukocytes,Ua NEGATIVE NEGATIVE   RBC / HPF 6-10 0 - 5 RBC/hpf   WBC, UA 0-5 0 - 5 WBC/hpf   Bacteria, UA NONE SEEN NONE SEEN   Squamous Epithelial / LPF 0-5 0 - 5   Mucus PRESENT   Pregnancy, urine POC     Status: Abnormal   Collection Time: 02/06/19  6:12 PM  Result Value Ref Range   Preg Test, Ur POSITIVE (A) NEGATIVE  hCG, quantitative, pregnancy     Status: Abnormal   Collection Time: 02/06/19  7:12 PM  Result Value Ref Range   hCG, Beta Chain, Quant, S 9,412 (H) <5 mIU/mL  CBC     Status: None   Collection Time: 02/06/19  7:12 PM  Result Value Ref Range   WBC 8.4 4.0 - 10.5 K/uL   RBC 4.44 3.87 - 5.11 MIL/uL   Hemoglobin 14.6 12.0 - 15.0 g/dL   HCT 42.1 36.0 - 46.0 %   MCV 94.8 80.0 - 100.0 fL   MCH 32.9 26.0 - 34.0 pg   MCHC 34.7 30.0 - 36.0 g/dL   RDW 11.5 11.5 - 15.5 %   Platelets 237 150 - 400 K/uL   nRBC 0.0 0.0 - 0.2 %  Wet prep, genital     Status: Abnormal   Collection Time: 02/06/19  7:31 PM  Result Value Ref Range   Yeast Wet Prep HPF POC NONE SEEN NONE SEEN   Trich, Wet Prep NONE SEEN NONE  SEEN   Clue Cells Wet Prep HPF POC PRESENT (A) NONE SEEN   WBC, Wet Prep HPF POC MANY (A) NONE SEEN   Sperm NONE SEEN    US Ob Comp Less 14 Wks  Result Date: 02/06/2019 CLINICAL DATA:  Initial evaluation for acute vaginal bleeding, early pregnancy. EXAM: OBSTETRIC <14 WK  US AND TRANSVAGINAL OB US TECHNIQUE: Both transabdominal and transvaginal ultrasound examinations were performed for complete evaluation of the gestation as well as the maternal uterus, adnexal regions, and pelvic cul-de-sac. Transvaginal technique was performed to assess early pregnancy. COMPARISON:  None available. FINDINGS: Intrauterine gestational sac: Single Yolk sac:  Present Embryo:  Present Cardiac Activity: Present Heart Rate: 78 bpm MSD: 9.1 mm   5 w   5 d CRL:  2.39 mm   5 w   5 d                  US EDC: 10/04/2019 Subchorionic hemorrhage:  None visualized. Maternal uterus/adnexae: Ovaries are normal in appearance bilaterally. No adnexal mass or free fluid. IMPRESSION: 1. Single viable intrauterine pregnancy, estimated gestational age [redacted] weeks and 5 days based on crown-rump length, with ultrasound EDC of 10/04/2019. 2. Fetal bradycardia. 3. No other acute maternal uterine or adnexal abnormality identified. Electronically Signed   By: Rise MuBenjamin  McClintock M.D.   On: 02/06/2019 20:04   [place] Ob Transvaginal  Result Date: 02/06/2019 CLINICAL DATA:  Initial evaluation for acute vaginal bleeding, early pregnancy. EXAM: OBSTETRIC <14 WK US AND TRANSVAGINAL OB US TECHNIQUE: Both transabdominal and transvaginal ultrasound examinations were performed for complete evaluation of the gestation as well as the maternal uterus, adnexal regions, and pelvic cul-de-sac. Transvaginal technique was performed to assess early pregnancy. COMPARISON:  None available. FINDINGS: Intrauterine gestational sac: Single Yolk sac:  Present Embryo:  Present Cardiac Activity: Present Heart Rate: 78 bpm MSD: 9.1 mm   5 w   5 d CRL:  2.39 mm   5 w   5 d                   US EDC: 10/04/2019 Subchorionic hemorrhage:  None visualized. Maternal uterus/adnexae: Ovaries are normal in appearance bilaterally. No adnexal mass or free fluid. IMPRESSION: 1. Single viable intrauterine pregnancy, estimated gestational age [redacted] weeks and 5 days based on crown-rump length, with ultrasound EDC of 10/04/2019. 2. Fetal bradycardia. 3. No other acute maternal uterine or adnexal abnormality identified. Electronically Signed   By: Rise MuBenjamin  McClintock M.D.   On: 02/06/2019 20:04     MDM Labs TVUS Limited Physical Exam Assessment and Plan  28 year old G5P3013 SIUP at 5.5 weeks Vaginal Bleeding Bacterial Vaginosis Fetal Bradycardia  -Lab and US findings discussed. -Informed of fetal bradycardia noted on US and need for repeat US in 2-3 weeks. -Wet prep returns significant for clue cells -Results discussed with patient -Rx for Metrogel -Patient reports increased bleeding since arrival, but declines pelvic exam.  -Encouraged to start PNV -Bleeding Precautions given. -Discussed pelvic rest for 72 hours after last bleeding incident.  -Patient denies questions or concerns. -Encouraged to call or return to MAU if symptoms worsen or with the onset of new symptoms. -Discharged to home in stable condition.   Cherre RobinsJessica L Rillie Riffel MSN, CNM 02/06/2019, 8:39 PM

## 2019-02-06 NOTE — MAU Note (Signed)
Pt had positive UPT(x6) at home. Started having some vaginal bleeding today. Had some cramping yesterday but none today.

## 2019-02-06 NOTE — Discharge Instructions (Signed)
Vaginal Bleeding During Pregnancy, First Trimester ° °A small amount of bleeding from the vagina (spotting) is relatively common during early pregnancy. It usually stops on its own. Various things may cause bleeding or spotting during early pregnancy. Some bleeding may be related to the pregnancy, and some may not. In many cases, the bleeding is normal and is not a problem. However, bleeding can also be a sign of something serious. Be sure to tell your health care provider about any vaginal bleeding right away. °Some possible causes of vaginal bleeding during the first trimester include: °· Infection or inflammation of the cervix. °· Growths (polyps) on the cervix. °· Miscarriage or threatened miscarriage. °· Pregnancy tissue developing outside of the uterus (ectopic pregnancy). °· A mass of tissue developing in the uterus due to an egg being fertilized incorrectly (molar pregnancy). °Follow these instructions at home: °Activity °· Follow instructions from your health care provider about limiting your activity. Ask what activities are safe for you. °· If needed, make plans for someone to help with your regular activities. °· Do not have sex or orgasms until your health care provider says that this is safe. °General instructions °· Take over-the-counter and prescription medicines only as told by your health care provider. °· Pay attention to any changes in your symptoms. °· Do not use tampons or douche. °· Write down how many pads you use each day, how often you change pads, and how soaked (saturated) they are. °· If you pass any tissue from your vagina, save the tissue so you can show it to your health care provider. °· Keep all follow-up visits as told by your health care provider. This is important. °Contact a health care provider if: °· You have vaginal bleeding during any part of your pregnancy. °· You have cramps or labor pains. °· You have a fever. °Get help right away if: °· You have severe cramps in your  back or abdomen. °· You pass large clots or a large amount of tissue from your vagina. °· Your bleeding increases. °· You feel light-headed or weak, or you faint. °· You have chills. °· You are leaking fluid or have a gush of fluid from your vagina. °Summary °· A small amount of bleeding (spotting) from the vagina is relatively common during early pregnancy. °· Various things may cause bleeding or spotting in early pregnancy. °· Be sure to tell your health care provider about any vaginal bleeding right away. °This information is not intended to replace advice given to you by your health care provider. Make sure you discuss any questions you have with your health care provider. °Document Released: 04/05/2005 Document Revised: 10/15/2018 Document Reviewed: 09/28/2016 °Elsevier Patient Education © 2020 [employer] ° °

## 2019-02-07 LAB — HIV ANTIBODY (ROUTINE TESTING W REFLEX): HIV Screen 4th Generation wRfx: NONREACTIVE

## 2019-02-08 LAB — GC/CHLAMYDIA PROBE AMP (~~LOC~~) NOT AT ARMC
Chlamydia: NEGATIVE
Neisseria Gonorrhea: NEGATIVE

## 2019-02-09 ENCOUNTER — Encounter (HOSPITAL_COMMUNITY): Payer: Self-pay | Admitting: *Deleted

## 2019-02-09 ENCOUNTER — Other Ambulatory Visit: Payer: Self-pay

## 2019-02-09 ENCOUNTER — Inpatient Hospital Stay (HOSPITAL_COMMUNITY)
Admission: AD | Admit: 2019-02-09 | Discharge: 2019-02-09 | Disposition: A | Discharge status: Home / Self Care | Payer: Medicaid Other | Attending: Obstetrics & Gynecology | Admitting: Obstetrics & Gynecology

## 2019-02-09 ENCOUNTER — Inpatient Hospital Stay (HOSPITAL_COMMUNITY): Payer: Medicaid Other

## 2019-02-09 DIAGNOSIS — R1031 Right lower quadrant pain: Secondary | ICD-10-CM | POA: Insufficient documentation

## 2019-02-09 DIAGNOSIS — Z87891 Personal history of nicotine dependence: Secondary | ICD-10-CM | POA: Insufficient documentation

## 2019-02-09 DIAGNOSIS — Z880 Allergy status to penicillin: Secondary | ICD-10-CM | POA: Insufficient documentation

## 2019-02-09 DIAGNOSIS — O039 Complete or unspecified spontaneous abortion without complication: Secondary | ICD-10-CM

## 2019-02-09 DIAGNOSIS — Z3A01 Less than 8 weeks gestation of pregnancy: Secondary | ICD-10-CM | POA: Diagnosis not present

## 2019-02-09 LAB — CBC
HCT: 42.3 % (ref 36.0–46.0)
Hemoglobin: 14.2 g/dL (ref 12.0–15.0)
MCH: 32.3 pg (ref 26.0–34.0)
MCHC: 33.6 g/dL (ref 30.0–36.0)
MCV: 96.4 fL (ref 80.0–100.0)
Platelets: 229 10*3/uL (ref 150–400)
RBC: 4.39 MIL/uL (ref 3.87–5.11)
RDW: 11.5 % (ref 11.5–15.5)
WBC: 8.1 10*3/uL (ref 4.0–10.5)
nRBC: 0 % (ref 0.0–0.2)

## 2019-02-09 LAB — RAPID URINE DRUG SCREEN, HOSP PERFORMED
Amphetamines: NOT DETECTED
Barbiturates: NOT DETECTED
Benzodiazepines: NOT DETECTED
Cocaine: NOT DETECTED
Opiates: NOT DETECTED
Tetrahydrocannabinol: NOT DETECTED

## 2019-02-09 LAB — HCG, QUANTITATIVE, PREGNANCY: hCG, Beta Chain, Quant, S: 1865 m[IU]/mL — ABNORMAL HIGH (ref <5)

## 2019-02-09 MED ORDER — ACETAMINOPHEN 500 MG PO TABS
1000.0000 mg | ORAL_TABLET | Freq: Once | ORAL | Status: AC
  Administered 2019-02-09: 1000 mg via ORAL
  Filled 2019-02-09: qty 2

## 2019-02-09 NOTE — Discharge Instructions (Signed)
Miscarriage °A miscarriage is the loss of an unborn baby (fetus) before the 20th week of pregnancy. °Follow these instructions at home: °Medicines ° °· Take over-the-counter and prescription medicines only as told by your doctor. °· If you were prescribed antibiotic medicine, take it as told by your doctor. Do not stop taking the antibiotic even if you start to feel better. °· Do not take NSAIDs unless your doctor says that this is safe for you. NSAIDs include aspirin and ibuprofen. These medicines can cause bleeding. °Activity °· Rest as directed. Ask your doctor what activities are safe for you. °· Have someone help you at home during this time. °General instructions °· Write down how many pads you use each day and how soaked they are. °· Watch the amount of tissue or clumps of blood (blood clots) that you pass from your vagina. Save any large amounts of tissue for your doctor. °· Do not use tampons, douche, or have sex until your doctor approves. °· To help you and your partner with the process of grieving, talk with your doctor or seek counseling. °· When you are ready, meet with your doctor to talk about steps you should take for your health. Also, talk with your doctor about steps to take to have a healthy pregnancy in the future. °· Keep all follow-up visits as told by your doctor. This is important. °Contact a doctor if: °· You have a fever or chills. °· You have vaginal discharge that smells bad. °· You have more bleeding. °Get help right away if: °· You have very bad cramps or pain in your back or belly. °· You pass clumps of blood that are walnut-sized or larger from your vagina. °· You pass tissue that is walnut-sized or larger from your vagina. °· You soak more than 1 regular pad in an hour. °· You get light-headed or weak. °· You faint (pass out). °· You have feelings of sadness that do not go away, or you have thoughts of hurting yourself. °Summary °· A miscarriage is the loss of an unborn baby before  the 20th week of pregnancy. °· Follow your doctor's instructions for home care. Keep all follow-up appointments. °· To help you and your partner with the process of grieving, talk with your doctor or seek counseling. °This information is not intended to replace advice given to you by your health care provider. Make sure you discuss any questions you have with your health care provider. °Document Released: 09/18/2011 Document Revised: 10/18/2018 Document Reviewed: 08/01/2016 °Elsevier Patient Education © 2020 [employer] ° °

## 2019-02-09 NOTE — MAU Provider Note (Signed)
History     CSN: [id number]  Arrival date and time: 02/09/19 1925   First Provider Initiated Contact with Patient 02/09/19 2009       Chief Complaint  Patient presents with  . Vaginal Bleeding  . Abdominal Pain   Jennifer Chaney is a 28 y.o. G5P3 at [redacted]w[redacted]d by LMP who presents to MAU via EMS with [employer] following. She presents to MAU with complaints of abdominal pain and vaginal bleeding. Patient reports being at the pool and drank one glass of champagne. She reports symptoms started occurring around 1600 this afternoon. She reports vaginal bleeding is heavy like a menstrual cycle, with large clots according to patient. She reports abdominal pain is associated with vaginal bleeding. Describes abdominal pain as lower abdominal cramping that is intermittent with occasional sharp pain specific to right lower quadrant. Rates pain 8/10- she denies taking medication for abdominal pain but has consumed alcohol today. Was seen on 7/30 for vaginal bleeding and noted to have IUP with fetal bradycardia.    OB History    Gravida  5   Para  3   Term  3   Preterm      AB  1   Living  3     SAB  1   TAB      Ectopic      Multiple      Live Births  3        Obstetric Comments  Required blood transfusion after first delivery        Past Medical History:  Diagnosis Date  . Blood transfusion without reported diagnosis    first preg  . Headache   . Infection    UTI  . Preterm labor    pt-contractions with first pregnancy-del term  . Scoliosis 2014    Past Surgical History:  Procedure Laterality Date  . BREAST SURGERY     implants  . HERNIA REPAIR    . POSTERIOR LUMBAR FUSION 4 LEVEL N/A 09/08/2017   Procedure: Thoracic twelve - Lumbar four pedicle screw fixation. Lumbar two laminectomy for burst fracture;  Surgeon: Erline Levine, MD;  Location: [facility];  Service: Neurosurgery;  Laterality: N/A;    Family History  Problem Relation Age of Onset  . Healthy Mother   . Healthy  Father     Social History   Tobacco Use  . Smoking status: Former Research scientist (life sciences)  . Smokeless tobacco: Never Used  . Tobacco comment:  smokes hookah some days  Substance Use Topics  . Alcohol use: Yes    Alcohol/week: 1.0 standard drinks    Types: 1 Glasses of wine per week    Comment: one glass of champagne today  . Drug use: No    Allergies:  Allergies  Allergen Reactions  . Penicillins Other (See Comments)    Reaction unknown Has patient had a PCN reaction causing immediate rash, facial/tongue/throat swelling, SOB or lightheadedness with hypotension: unknown Has patient had a PCN reaction causing severe rash involving mucus membranes or skin necrosis: unknown Has patient had a PCN reaction that required hospitalization: yes Has patient had a PCN reaction occurring within the last 10 years: yes - when I was in labor at the hospital If all of the above answers are "NO", then may proceed with     Medications Prior to Admission  Medication Sig Dispense Refill Last Dose  . metroNIDAZOLE (METROGEL VAGINAL) 0.75 % vaginal gel Place 1 Applicatorful vaginally at bedtime. Insert one applicator, at bedtime, for  5 nights. 70 g 0 02/08/2019 at Unknown time    Review of Systems  Constitutional: Negative.   Respiratory: Negative.   Cardiovascular: Negative.   Gastrointestinal: Positive for abdominal pain. Negative for constipation, diarrhea, nausea and vomiting.  Genitourinary: Positive for vaginal bleeding. Negative for difficulty urinating, dysuria, frequency, pelvic pain, urgency and vaginal discharge.  Musculoskeletal: Negative.   Neurological: Negative.    Physical Exam   Blood pressure 114/62, pulse (!) 105, temperature 99.4 F (37.4 C), temperature source Oral, resp. rate 16, height 5\' 7"  (1.702 m), weight 68 kg, last menstrual period 11/29/2018.  Physical Exam  Nursing note and vitals reviewed. Constitutional: She is oriented to person, place, and time. She appears well-developed  and well-nourished. No distress.  Cardiovascular: Normal rate and regular rhythm.  Respiratory: Effort normal and breath sounds normal. No respiratory distress. She has no wheezes. She has no rales.  GI: Soft. She exhibits no distension. There is no abdominal tenderness. There is no rebound.  Genitourinary:    Genitourinary Comments: Pelvic examination refused by patient   Musculoskeletal:        General: No edema.  Neurological: She is alert and oriented to person, place, and time.  Psychiatric: She has a normal mood and affect. Her behavior is normal. Thought content normal.   Bedside US performed  Pt informed that the ultrasound is considered a limited OB ultrasound and is not intended to be a complete ultrasound exam.  Patient also informed that the ultrasound is not being completed with the intent of assessing for fetal or placental anomalies or any pelvic abnormalities.  Explained that the purpose of today's ultrasound is to assess for  viability.  Patient acknowledges the purpose of the exam and the limitations of the study.    No embryo seen on bedside US, formal US ordered to confirm  MAU Course  Procedures  MDM Orders Placed This Encounter  Procedures  . US OB Transvaginal  . CBC  . Urine rapid drug screen (hosp performed)  . hCG, quantitative, pregnancy   Labs and US report reviewed:  Results for orders placed or performed during the hospital encounter of 02/09/19 (from the past 24 hour(s))  Urine rapid drug screen (hosp performed)     Status: None   Collection Time: 02/09/19  8:04 PM  Result Value Ref Range   Opiates NONE DETECTED NONE DETECTED   Cocaine NONE DETECTED NONE DETECTED   Benzodiazepines NONE DETECTED NONE DETECTED   Amphetamines NONE DETECTED NONE DETECTED   Tetrahydrocannabinol NONE DETECTED NONE DETECTED   Barbiturates NONE DETECTED NONE DETECTED  CBC     Status: None   Collection Time: 02/09/19  8:34 PM  Result Value Ref Range   WBC 8.1 4.0 - 10.5  K/uL   RBC 4.39 3.87 - 5.11 MIL/uL   Hemoglobin 14.2 12.0 - 15.0 g/dL   HCT 16.142.3 09.636.0 - 04.546.0 %   MCV 96.4 80.0 - 100.0 fL   MCH 32.3 26.0 - 34.0 pg   MCHC 33.6 30.0 - 36.0 g/dL   RDW 40.911.5 81.111.5 - 91.415.5 %   Platelets 229 150 - 400 K/uL   nRBC 0.0 0.0 - 0.2 %  hCG, quantitative, pregnancy     Status: Abnormal   Collection Time: 02/09/19  8:34 PM  Result Value Ref Range   hCG, Beta Chain, Quant, S 1,865 (H) <5 mIU/mL   [place] Ob Transvaginal  Result Date: 02/09/2019 CLINICAL DATA:  Pelvic pain.  Possible spontaneous abortion. EXAM: TRANSVAGINAL OB  ULTRASOUND TECHNIQUE: Transvaginal ultrasound was performed for complete evaluation of the gestation as well as the maternal uterus, adnexal regions, and pelvic cul-de-sac. COMPARISON:  02/06/2019 FINDINGS: Intrauterine gestational sac: None Yolk sac:  None visualized Embryo:  None visualized Cardiac Activity: Not visualized Subchorionic hemorrhage:  None visualized. Maternal uterus/adnexae: Right ovary: Normal Left ovary: Normal Other :Endometrium appears heterogeneous measuring 1.3 cm. No IUP identified. Free fluid:  None IMPRESSION: 1. Follow-up exam from 02/06/2019 shows no intrauterine gestational sac, embryo or yolk sac. Findings meet definitive criteria for failed pregnancy. This follows SRU consensus guidelines: Diagnostic Criteria for Nonviable Pregnancy Early in the First Trimester. Macy Mis Engl J Med [id number];369:1443-51. Electronically Signed   By: Signa Kellaylor  Stroud M.D.   On: 02/09/2019 21:02   Discussed results with patient, educated on what to expect and reasons to return to MAU for evaluation. Patient emotional as expected. Support given.  Follow up in the office in 2 weeks  Pt stable at time of discharge Assessment and Plan   1. SAB (spontaneous abortion)    Discharge home Follow up in the clinic in 2 weeks for SAB- message sent to office for scheduling  Tylenol and ibuprofen for pain control  Discussed reasons to return to MAU   Follow-up  Information    [facility]. Schedule an appointment as soon as possible for a visit in 2 week(s).   Specialty: Obstetrics and Gynecology Why: Make appointment to be seen in 2 weeks following today's examination  Contact information: [address], [address] [id number] mc [place] [phone]         Allergies as of 02/09/2019      Reactions   Penicillins Other (See Comments)   Reaction unknown Has patient had a PCN reaction causing immediate rash, facial/tongue/throat swelling, SOB or lightheadedness with hypotension: unknown Has patient had a PCN reaction causing severe rash involving mucus membranes or skin necrosis: unknown Has patient had a PCN reaction that required hospitalization: yes Has patient had a PCN reaction occurring within the last 10 years: yes - when I was in labor at the hospital If all of the above answers are "NO", then may proceed with       Medication List    TAKE these medications   metroNIDAZOLE 0.75 % vaginal gel Commonly known as: METROGEL VAGINAL Place 1 Applicatorful vaginally at bedtime. Insert one applicator, at bedtime, for 5 nights.      Sharyon CableVeronica C Manal Kreutzer CNM 02/09/2019, 9:13 PM

## 2019-02-09 NOTE — MAU Note (Addendum)
Pt c/o spotting two days ago and was seen in ED. Arrived VIA EMS and accompanied by police in tears and upset Saying bleeding increased today and she changed her pad 3x in 3 hours so she put a tampon in. Right lower abd pain began with the bleeding today around 1600. Pt was visiting her brother at his pool with a bottle of chamgpagne and some music. The pool mgr told her she needed to " get the fuck out with your alcohol and music you Fuckin hispanic" She was starting to leave and the mgr called the police. When the police arrived she left in  Her car with her children. They followed her to her apt. She says she was handcuffed at her apt without having been explained why.  [place] in room explaining rights to refuse BAC. Pt agreed to have blood drawn saying she had one glass of champagne.  2020-  Bedside U/S .  2120-  Offered grief materials and crocheted heart. Pt refused all comfort material saying she did not want to remember this day.

## 2019-02-26 ENCOUNTER — Ambulatory Visit: Payer: Medicaid Other | Admitting: Obstetrics & Gynecology

## 2019-02-26 NOTE — Progress Notes (Deleted)
   Patient did not show up today for her scheduled appointment.   Vern Prestia, MD, FACOG Obstetrician & Gynecologist, [facility]  

## 2019-08-16 IMAGING — US OBSTETRIC <14 WK ULTRASOUND
1 series · 15 of 28 positions shown · non-contrast
Comparison: None available.

CLINICAL DATA: Initial evaluation for acute vaginal bleeding, early
pregnancy.

EXAM:
OBSTETRIC <14 WK US AND TRANSVAGINAL OB US
TECHNIQUE: Both transabdominal and transvaginal ultrasound examinations were
performed for complete evaluation of the gestation as well as the
maternal uterus, adnexal regions, and pelvic cul-de-sac.
Transvaginal technique was performed to assess early pregnancy.

[Series 1: obstetric <14 wk ultrasound · 15 of 56 slices shown]
[im 1/56]
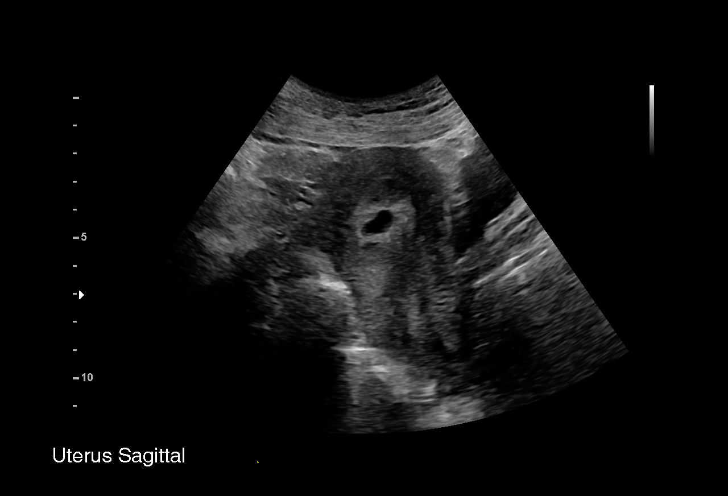
[im 5/56]
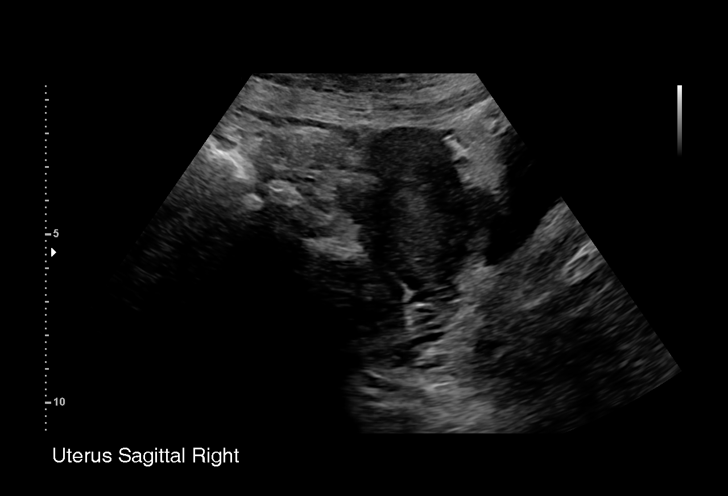
[im 9/56]
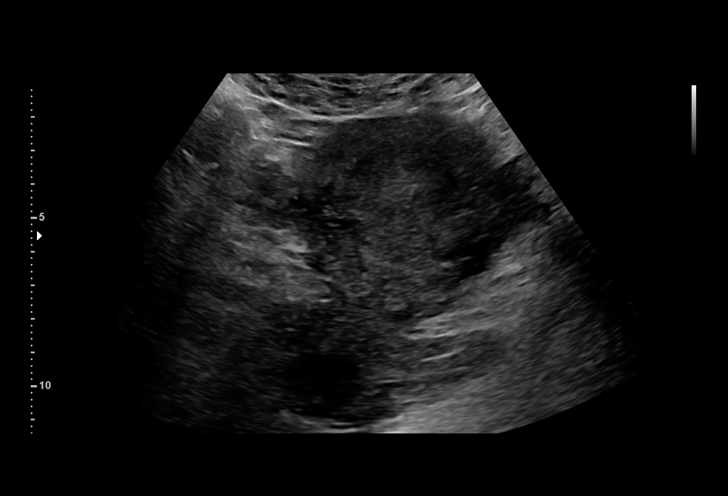
[im 13/56]
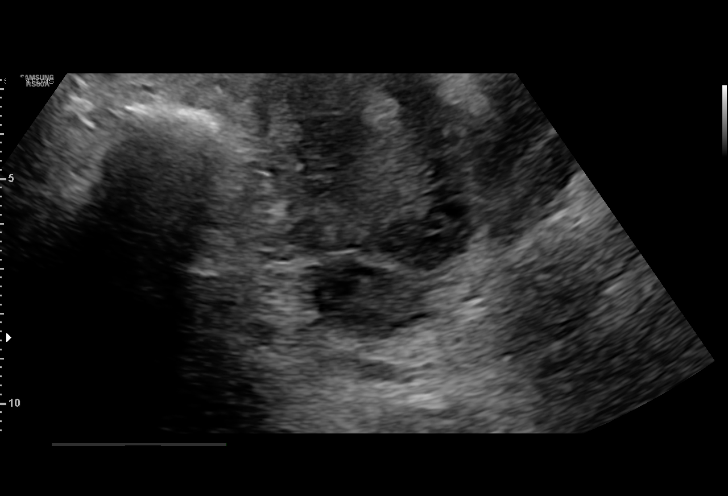
[im 17/56]
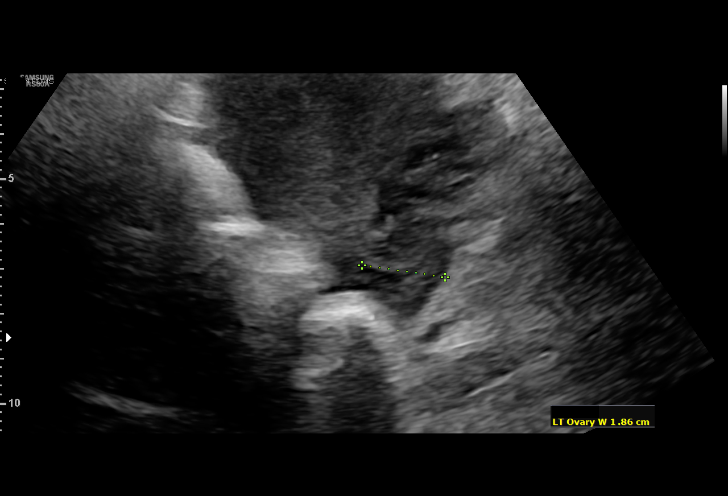
[im 21/56]
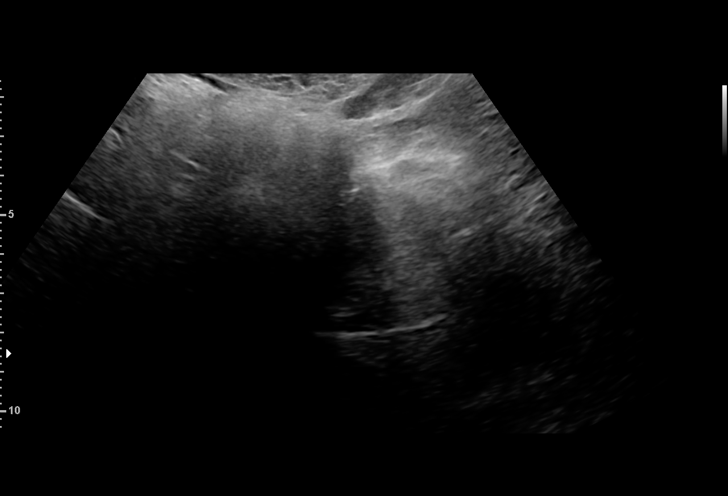
[im 25/56]
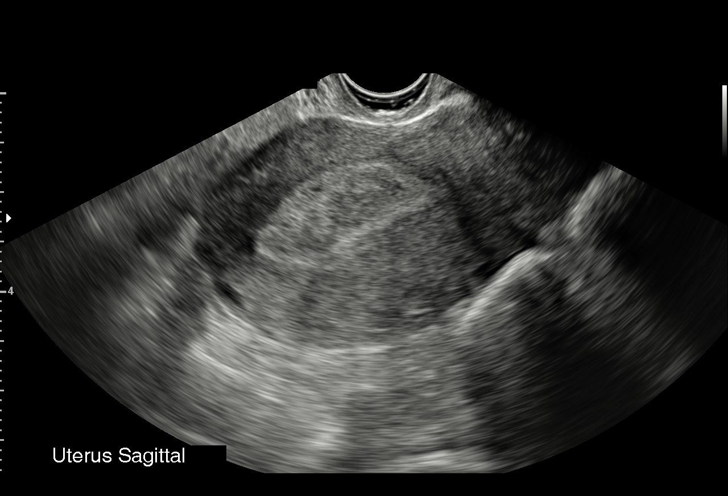
[im 29/56]
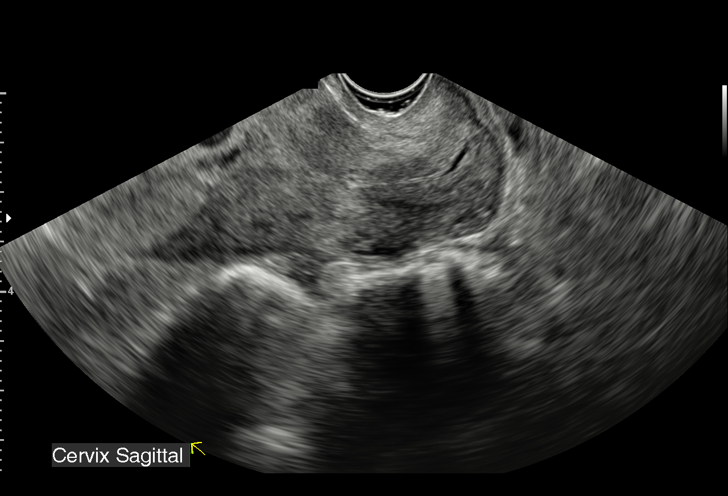
[im 31/56]
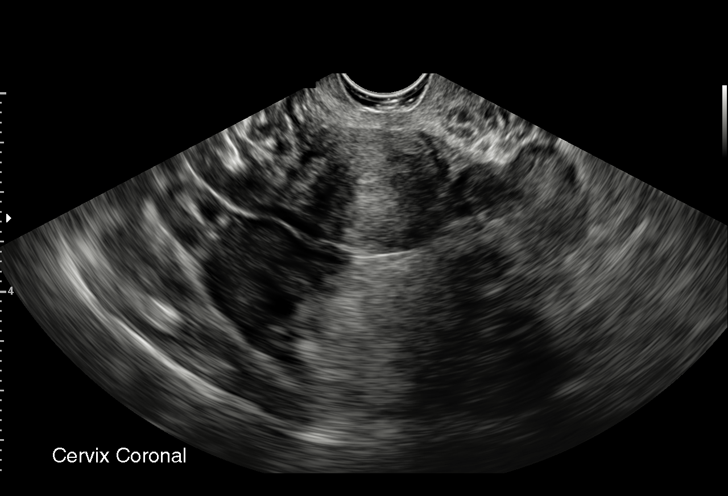
[im 35/56]
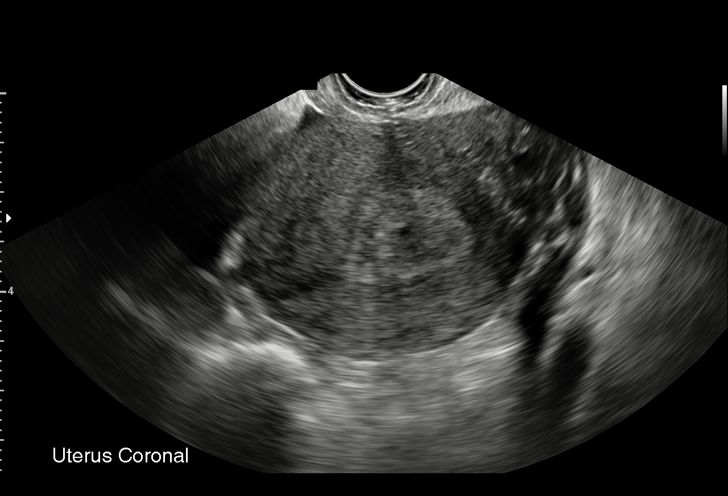
[im 39/56]
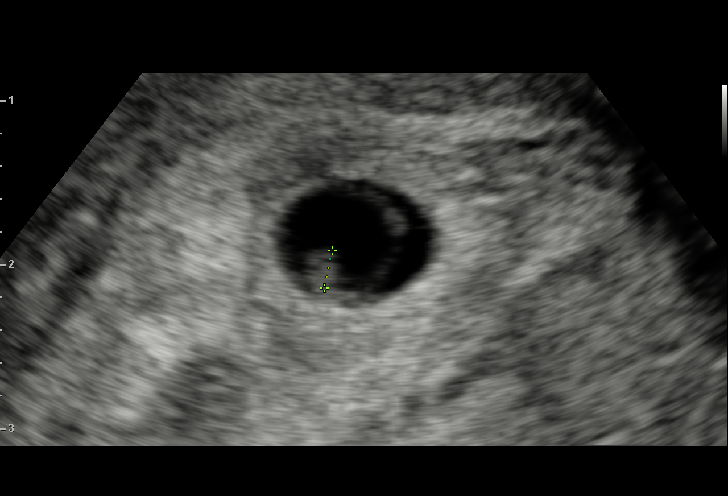
[im 43/56]
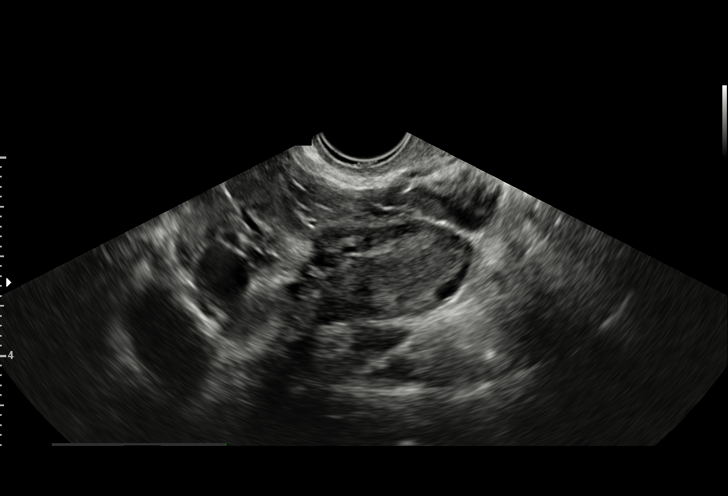
[im 47/56]
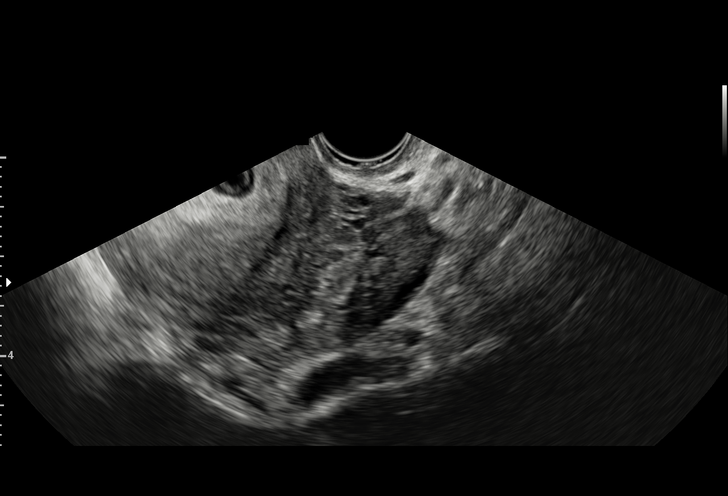
[im 51/56]
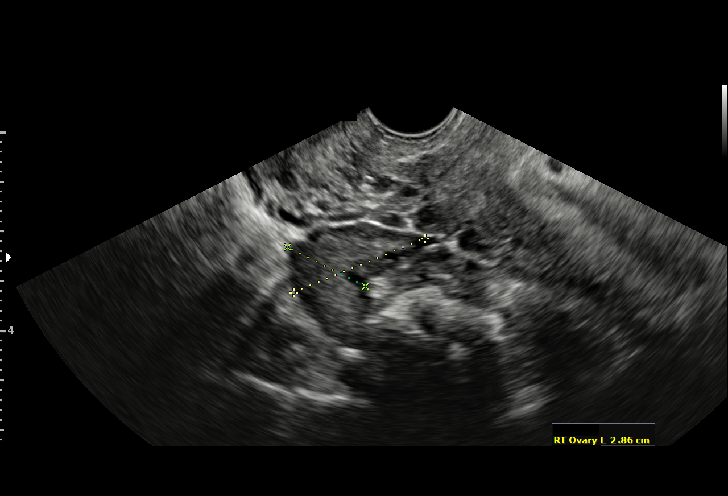
[im 56/56]
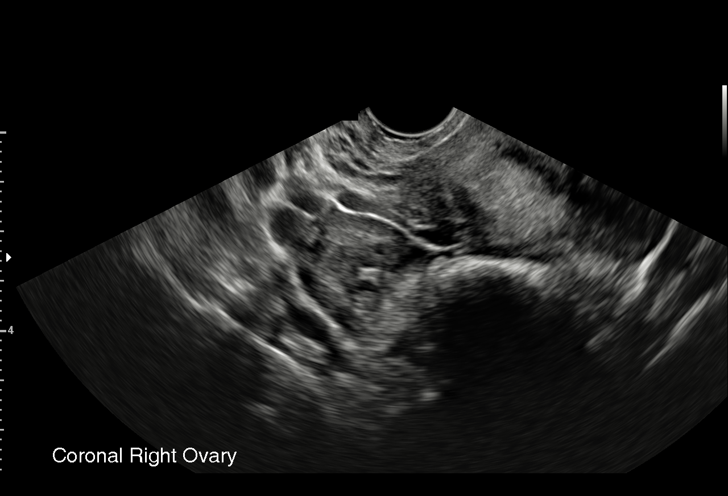

[15 of 28 positions shown; findings below may reference images not displayed]

FINDINGS: Intrauterine gestational sac: Single

Yolk sac:  Present

Embryo:  Present

Cardiac Activity: Present

Heart Rate: 78 bpm

MSD: 9.1 mm   5 w   5 d

CRL:  2.39 mm   5 w   5 d                  US EDC: 10/04/2019

Subchorionic hemorrhage:  None visualized.

Maternal uterus/adnexae: Ovaries are normal in appearance
bilaterally. No adnexal mass or free fluid.
IMPRESSION: 1. Single viable intrauterine pregnancy, estimated gestational age 5
weeks and 5 days based on crown-rump length, with ultrasound EDC of
10/04/2019.
2. Fetal bradycardia.
3. No other acute maternal uterine or adnexal abnormality
identified.

## 2019-08-19 IMAGING — US TRANSVAGINAL OB ULTRASOUND
1 series · 15 of 22 positions shown · non-contrast
Comparison: 02/06/2019

CLINICAL DATA: Pelvic pain.  Possible spontaneous abortion.

EXAM:
TRANSVAGINAL OB ULTRASOUND
TECHNIQUE: Transvaginal ultrasound was performed for complete evaluation of the
gestation as well as the maternal uterus, adnexal regions, and
pelvic cul-de-sac.

[Series 1: transvaginal ob ultrasound · 22 acquisitions, 15 frames shown]
[im 1/22]
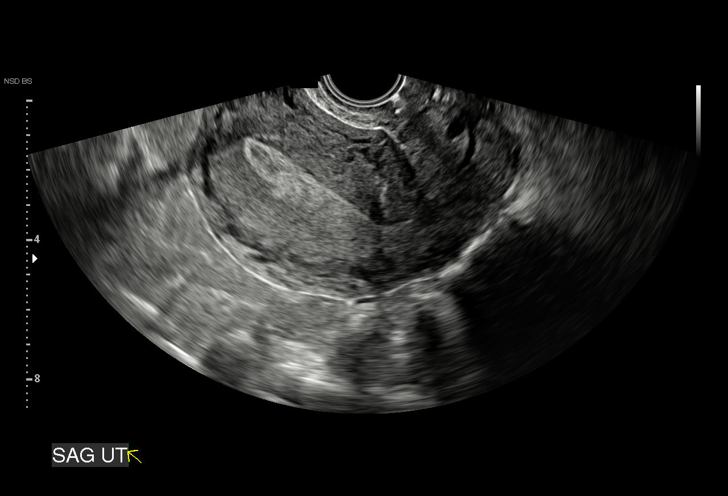
[im 3/22]
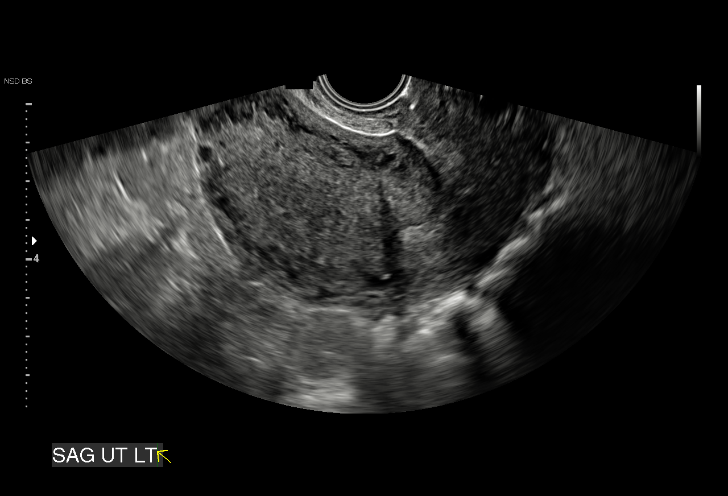
[im 4/22]
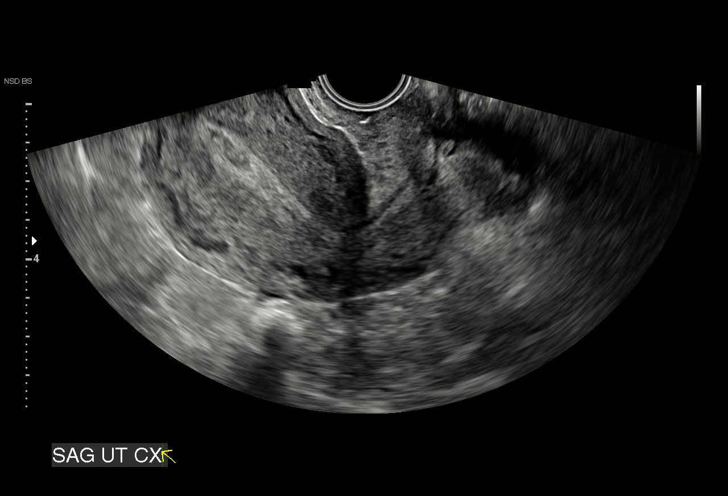
[im 6/22]
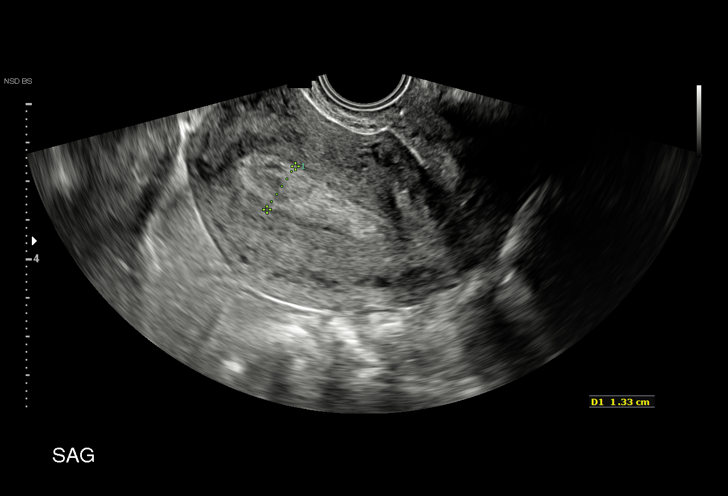
[im 7/22]
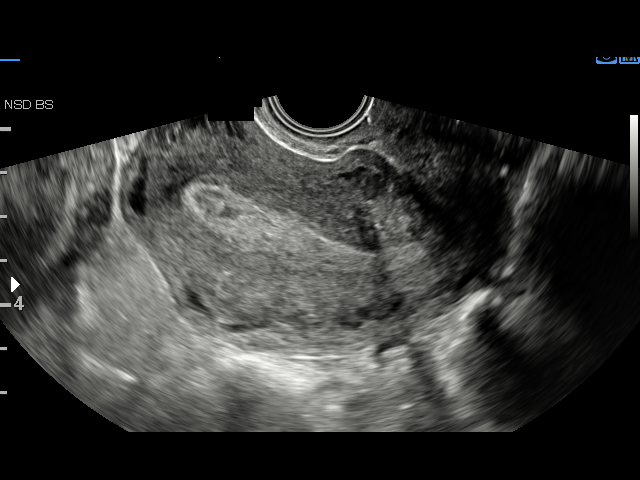
[im 9/22]
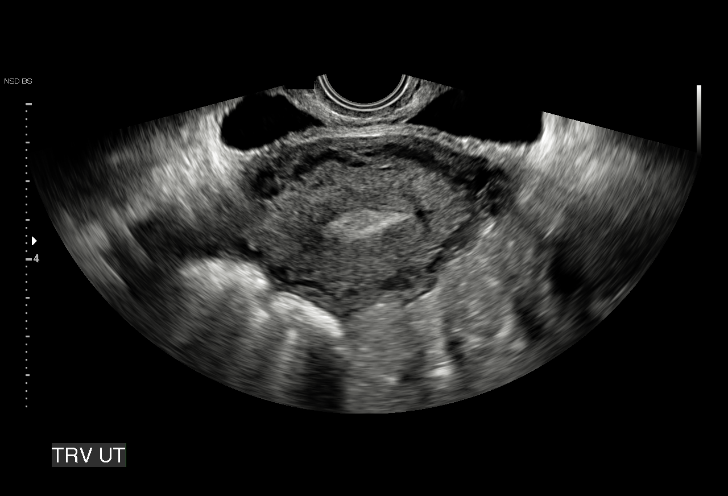
[im 10/22]
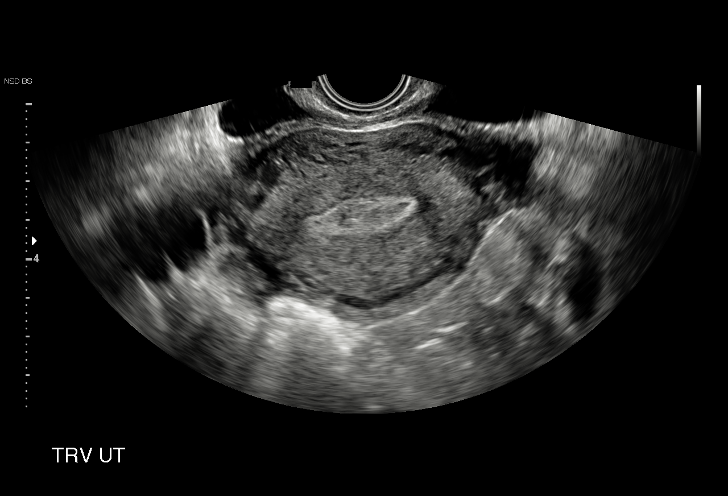
[im 12/22]
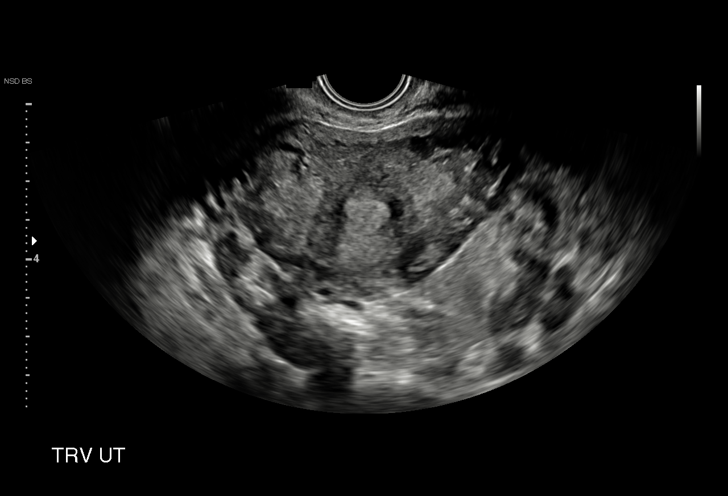
[im 13/22]
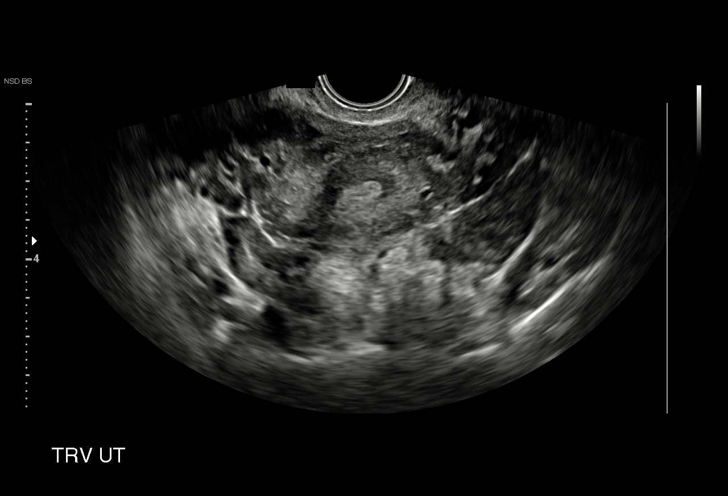
[im 14/22]
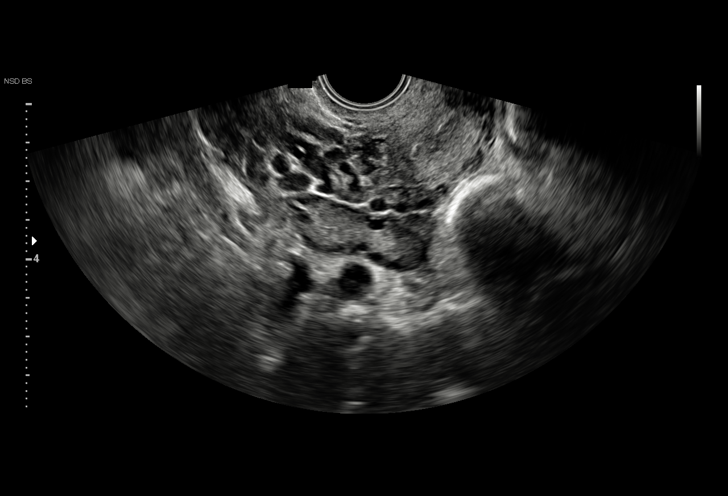
[im 16/22]
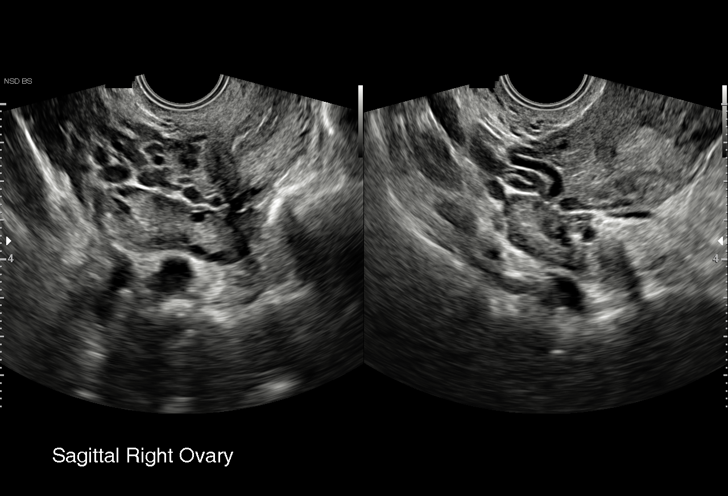
[im 17/22]
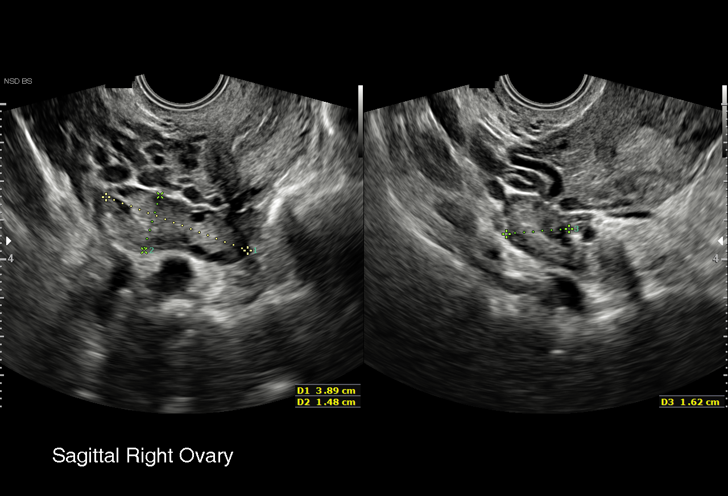
[im 19/22]
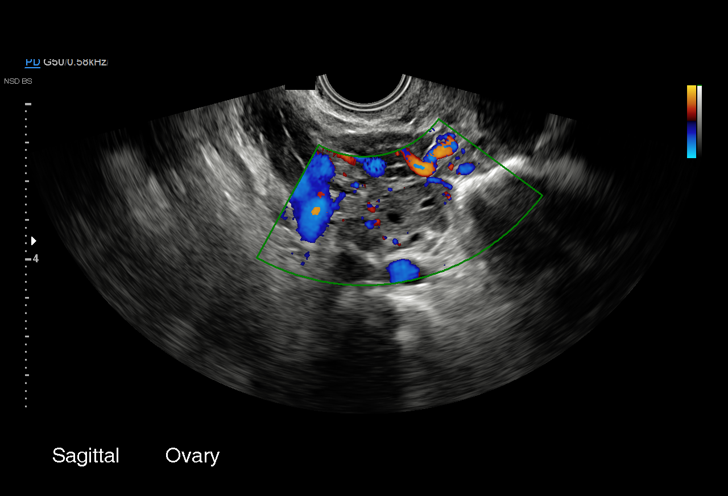
[im 20/22]
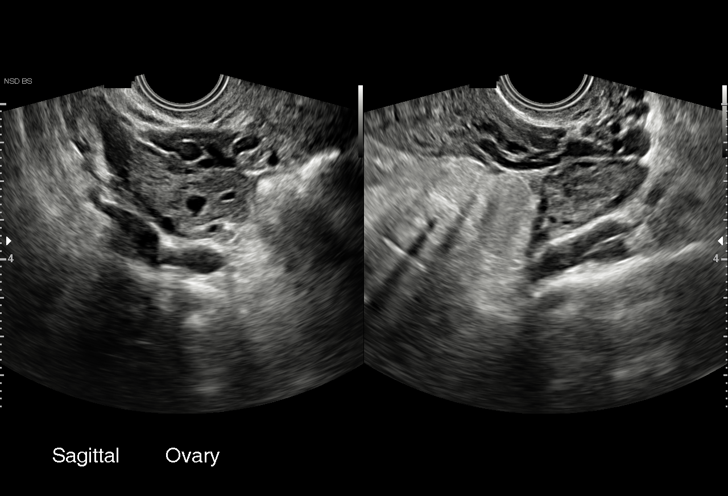
[im 22/22]
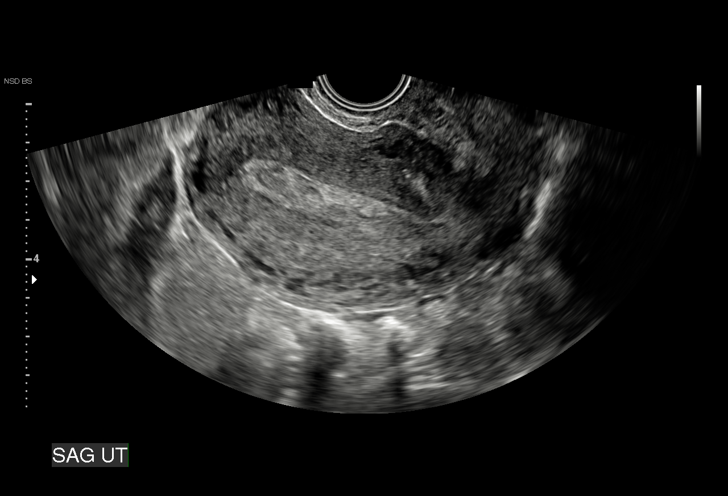

[15 of 22 positions shown; findings below may reference images not displayed]

FINDINGS: Intrauterine gestational sac: None

Yolk sac:  None visualized

Embryo:  None visualized

Cardiac Activity: Not visualized

Subchorionic hemorrhage:  None visualized.

Maternal uterus/adnexae:

Right ovary: Normal

Left ovary: Normal

Other :Endometrium appears heterogeneous measuring 1.3 cm. No IUP
identified.

Free fluid:  None
IMPRESSION: 1. Follow-up exam from 02/06/2019 shows no intrauterine gestational
sac, embryo or yolk sac. Findings meet definitive criteria for
failed pregnancy. This follows SRU consensus guidelines: Diagnostic
Criteria for Nonviable Pregnancy Early in the First Trimester. N
Engl J Med 4155;[DATE].
